# Patient Record
Sex: Male | Born: 1965 | Race: Black or African American | Hispanic: No | Marital: Married | Smoking: Never smoker
Health system: Southern US, Community
[De-identification: ages and names within clinical notes are randomized; demographics above are authoritative.]

## PROBLEM LIST (undated history)

## (undated) DIAGNOSIS — R351 Nocturia: Secondary | ICD-10-CM

## (undated) DIAGNOSIS — N200 Calculus of kidney: Secondary | ICD-10-CM

## (undated) DIAGNOSIS — Z87442 Personal history of urinary calculi: Secondary | ICD-10-CM

## (undated) DIAGNOSIS — Z8669 Personal history of other diseases of the nervous system and sense organs: Secondary | ICD-10-CM

## (undated) DIAGNOSIS — M51379 Other intervertebral disc degeneration, lumbosacral region without mention of lumbar back pain or lower extremity pain: Secondary | ICD-10-CM

## (undated) DIAGNOSIS — G4730 Sleep apnea, unspecified: Secondary | ICD-10-CM

## (undated) DIAGNOSIS — N189 Chronic kidney disease, unspecified: Secondary | ICD-10-CM

## (undated) DIAGNOSIS — M5137 Other intervertebral disc degeneration, lumbosacral region: Secondary | ICD-10-CM

## (undated) DIAGNOSIS — M5126 Other intervertebral disc displacement, lumbar region: Secondary | ICD-10-CM

## (undated) DIAGNOSIS — M48061 Spinal stenosis, lumbar region without neurogenic claudication: Secondary | ICD-10-CM

## (undated) DIAGNOSIS — J189 Pneumonia, unspecified organism: Secondary | ICD-10-CM

## (undated) DIAGNOSIS — M5136 Other intervertebral disc degeneration, lumbar region: Secondary | ICD-10-CM

## (undated) DIAGNOSIS — M4800 Spinal stenosis, site unspecified: Secondary | ICD-10-CM

## (undated) DIAGNOSIS — M5432 Sciatica, left side: Secondary | ICD-10-CM

## (undated) DIAGNOSIS — G4733 Obstructive sleep apnea (adult) (pediatric): Secondary | ICD-10-CM

## (undated) DIAGNOSIS — M51369 Other intervertebral disc degeneration, lumbar region without mention of lumbar back pain or lower extremity pain: Secondary | ICD-10-CM

---
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
## 2000-04-19 ENCOUNTER — Emergency Department (HOSPITAL_COMMUNITY): Admission: EM | Admit: 2000-04-19 | Discharge: 2000-04-19 | Payer: Self-pay | Admitting: Emergency Medicine

## 2005-04-12 ENCOUNTER — Ambulatory Visit: Payer: Self-pay | Admitting: Family Medicine

## 2005-04-30 ENCOUNTER — Ambulatory Visit: Payer: Self-pay | Admitting: Internal Medicine

## 2005-05-19 ENCOUNTER — Ambulatory Visit: Payer: Self-pay | Admitting: Family Medicine

## 2005-10-04 ENCOUNTER — Emergency Department (HOSPITAL_COMMUNITY): Admission: EM | Admit: 2005-10-04 | Discharge: 2005-10-04 | Payer: Self-pay | Admitting: Emergency Medicine

## 2006-03-14 ENCOUNTER — Emergency Department (HOSPITAL_COMMUNITY): Admission: EM | Admit: 2006-03-14 | Discharge: 2006-03-14 | Payer: Self-pay | Admitting: Emergency Medicine

## 2006-04-15 ENCOUNTER — Emergency Department (HOSPITAL_COMMUNITY): Admission: EM | Admit: 2006-04-15 | Discharge: 2006-04-15 | Payer: Self-pay | Admitting: Family Medicine

## 2007-07-10 DIAGNOSIS — Z8709 Personal history of other diseases of the respiratory system: Secondary | ICD-10-CM | POA: Insufficient documentation

## 2007-10-31 ENCOUNTER — Emergency Department (HOSPITAL_COMMUNITY): Admission: EM | Admit: 2007-10-31 | Discharge: 2007-10-31 | Payer: Self-pay | Admitting: Emergency Medicine

## 2011-07-02 LAB — URINALYSIS, ROUTINE W REFLEX MICROSCOPIC
Glucose, UA: NEGATIVE
Nitrite: NEGATIVE
Protein, ur: NEGATIVE
Urobilinogen, UA: 0.2

## 2011-07-02 LAB — URINE MICROSCOPIC-ADD ON

## 2011-10-13 ENCOUNTER — Ambulatory Visit (HOSPITAL_COMMUNITY): Admission: RE | Admit: 2011-10-13 | Payer: Self-pay | Source: Ambulatory Visit

## 2011-10-13 ENCOUNTER — Ambulatory Visit (HOSPITAL_COMMUNITY)
Admission: RE | Admit: 2011-10-13 | Discharge: 2011-10-13 | Disposition: A | Discharge status: Home / Self Care | Payer: Self-pay | Source: Ambulatory Visit | Attending: Cardiovascular Disease | Admitting: Cardiovascular Disease

## 2011-10-13 ENCOUNTER — Other Ambulatory Visit: Payer: Self-pay

## 2011-10-13 DIAGNOSIS — R9431 Abnormal electrocardiogram [ECG] [EKG]: Secondary | ICD-10-CM | POA: Insufficient documentation

## 2011-10-13 DIAGNOSIS — R079 Chest pain, unspecified: Secondary | ICD-10-CM | POA: Insufficient documentation

## 2012-06-22 ENCOUNTER — Other Ambulatory Visit (HOSPITAL_COMMUNITY): Payer: Self-pay | Admitting: Family Medicine

## 2012-06-22 DIAGNOSIS — R319 Hematuria, unspecified: Secondary | ICD-10-CM

## 2012-06-26 ENCOUNTER — Ambulatory Visit (HOSPITAL_COMMUNITY)
Admission: RE | Admit: 2012-06-26 | Discharge: 2012-06-26 | Disposition: A | Discharge status: Home / Self Care | Payer: BC Managed Care – PPO | Source: Ambulatory Visit | Attending: Family Medicine | Admitting: Family Medicine

## 2012-06-26 ENCOUNTER — Other Ambulatory Visit (HOSPITAL_COMMUNITY): Payer: Self-pay | Admitting: Family Medicine

## 2012-06-26 DIAGNOSIS — N200 Calculus of kidney: Secondary | ICD-10-CM | POA: Insufficient documentation

## 2012-06-26 DIAGNOSIS — R3129 Other microscopic hematuria: Secondary | ICD-10-CM | POA: Insufficient documentation

## 2012-06-26 DIAGNOSIS — K7689 Other specified diseases of liver: Secondary | ICD-10-CM | POA: Insufficient documentation

## 2012-06-26 DIAGNOSIS — R319 Hematuria, unspecified: Secondary | ICD-10-CM

## 2014-05-13 ENCOUNTER — Ambulatory Visit (INDEPENDENT_AMBULATORY_CARE_PROVIDER_SITE_OTHER): Payer: BC Managed Care – PPO | Admitting: Neurology

## 2014-05-13 ENCOUNTER — Encounter: Payer: Self-pay | Admitting: Neurology

## 2014-05-13 VITALS — BP 134/75 | HR 84 | Resp 17 | Ht 70.5 in | Wt 245.0 lb

## 2014-05-13 DIAGNOSIS — R0609 Other forms of dyspnea: Secondary | ICD-10-CM

## 2014-05-13 DIAGNOSIS — R6889 Other general symptoms and signs: Secondary | ICD-10-CM

## 2014-05-13 DIAGNOSIS — E669 Obesity, unspecified: Secondary | ICD-10-CM | POA: Insufficient documentation

## 2014-05-13 DIAGNOSIS — G4730 Sleep apnea, unspecified: Principal | ICD-10-CM

## 2014-05-13 DIAGNOSIS — IMO0002 Reserved for concepts with insufficient information to code with codable children: Secondary | ICD-10-CM

## 2014-05-13 DIAGNOSIS — R0683 Snoring: Secondary | ICD-10-CM

## 2014-05-13 DIAGNOSIS — R0989 Other specified symptoms and signs involving the circulatory and respiratory systems: Secondary | ICD-10-CM

## 2014-05-13 DIAGNOSIS — G4710 Hypersomnia, unspecified: Secondary | ICD-10-CM | POA: Insufficient documentation

## 2014-05-13 NOTE — Progress Notes (Signed)
[facility] SLEEP MEDICINE CONSULT   Provider:  Melvyn Novas, M D  Referring Provider: Erlinda Hong, MD Primary Care Physician:  Erlinda Hong, MD  Chief Complaint  Patient presents with  . New Evaluation    Room 10  . Sleep consult    HPI:  Daniel Deleon is a 48 y.o. part native [place]  male , who is seen here as a referral from Dr. Thurmond Butts for a sleep evaluation ,   Mr. Daniel Deleon, a nephew of Dr. Erlinda Hong by marriage, presents for an evaluation prior to a sleep study.  Mr. Daniel Deleon reports that he has a strong family history of a stroke with sleep apnea in that he has himself nor bowel that is affected. He reports snoring loudly and sometimes snoring or gasping for air and he wakes up. He has been witnessed to snore and and gasping for breath. His marital bedroom is cool quiet and dark.  Patient also reports that he has often vivid dreams. His sleep is fragmented : he feels that he has not slept through the night in quite a while.  He has also  3-4 bathroom breaks at night further fragmenting his sleep. Is not necessarily refreshed or restored in the morning when he wakes up and he desires to take naps. Nap  last about an hour but he feels much more refreshed after nap time , more then in the morning.  When he wakes up he has a dry mouth, he does not necessarily wake up with headaches but has a tendency to develop headaches especially during the school year.  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  He has been sleepier than the average  Individual, he has 3 school age children and attends their games , tec. He is more fatigued . He keeps regular sleep habits, going to bed around mid-night , he works up at 6 AM, and had 3 -4 arousals from nocturia. He wakes spontaneously , but not refreshed or restored. He is sleep deprived with less than 5.5 hours of  sleep at night. 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 Both play other sports: soccer, tennis and are running as well. He sleeps in hotels, but feels that sleep quality is even worse.   He drinks rarely SODAS only when driving, drinks  coffee, and no tea.  Non smoker.  No ETOH drinker.   Weight gain over 5 years about 20 pounds.   Review of Systems: Out of a complete 14 system review, the patient complains of only the following symptoms, and all other reviewed systems are negative.  snoring, witnessed apnea,EDS  Epworth 8   History   Social History  . Marital Status: Married    Spouse Name: Moishe Spice    Number of Children: 3  . Years of Education: Grad sch.   Occupational History  . Not on file.   Social History Main Topics  . Smoking status: Never Smoker   . Smokeless tobacco: Never Used  . Alcohol Use: No  . Drug Use: No  . Sexual Activity: Not on file   Other Topics Concern  . Not on file   Social History Narrative   Patient is married [name] and lives at home with his wife and three children.   Patient works full-time at [employer].   Patient has a college education [employer] school).  Patient is ambi-dextrous.   Patient drinks one soda daily.    Family History  Problem Relation Age of Onset  . Bladder Cancer Father   . Hypertension Father   . Hypertension Brother   . Diabetes Father     History reviewed. No pertinent past medical history.  Past Surgical History  Procedure Laterality Date  . Eye surgery  1977    Cyst removed    No current outpatient prescriptions on file.   No current facility-administered medications for this visit.    Allergies as of 05/13/2014 - Review Complete 05/13/2014  Allergen Reaction Noted  . Diphenhydramine hcl  07/10/2007    Vitals: BP 134/75  Pulse 84  Resp 17  Ht 5' 10.5" (1.791 m)  Wt 245 lb (111.131 kg)  BMI 34.65 kg/m2 Last Weight:  Wt Readings from Last 1  Encounters:  05/13/14 245 lb (111.131 kg)   Last Height:   Ht Readings from Last 1 Encounters:  05/13/14 5' 10.5" (1.791 m)    Physical exam:  General: The patient is awake, alert and appears not in acute distress. The patient is well groomed. Head: Normocephalic, atraumatic. Neck is supple. Mallampati 3 , neck circumference:19 inches , no TMJ , no clicking.  Cardiovascular:  Regular rate and rhythm , without  murmurs or carotid bruit, and without distended neck veins. Respiratory: Lungs are clear to auscultation. Skin:  Without evidence of edema, or rash Trunk: BMI is  elevated and patient  has normal posture.  Neurologic exam : The patient is awake and alert, oriented to place and time.  Memory subjective  described as intact. There is a normal attention span & concentration ability. Speech is fluent without dysarthria, dysphonia or aphasia. Mood and affect are appropriate.  Cranial nerves: Pupils are equal and briskly reactive to light. Funduscopic exam without evidence of pallor or edema. Extraocular movements  in vertical and horizontal planes intact and without nystagmus. Visual fields by finger perimetry are intact. Hearing to finger rub intact.  Facial sensation intact to fine touch. Facial motor strength is symmetric and tongue and uvula move midline.  Motor exam:   Normal tone and normal muscle bulk and symmetric normal strength in all extremities.  Sensory:  Fine touch, pinprick and vibration were normal.  Coordination: Rapid alternating movements in the fingers/hands is tested and normal.  Gait and station: Patient walks without assistive device .Marland Kitchen. Stance is stable and normal. Steps are unfragmented.  Deep tendon reflexes: in the  upper and lower extremities are symmetric and intact. Babinski maneuver response is downgoing.   Assessment:  After physical and neurologic examination, review of laboratory studies, imaging, neurophysiology testing and pre-existing records,  assessment is   1) hypersomnia, daily naps, nocturia, snoring and witnessed apneas. 2) He wakes up from  Vivid dreams, often with a racing heart but has not acted out dreams.  3) family history of OSA, HTN. And higher risk due to native [place].   Plan:  Treatment plan and additional workup : SPLIT night study - AH 10 and score at 3 % . CO2 if available. Get some supine sleep in the first 2 hours.  Mask fitting in lab ,if patient  has enough apnea to SPLIT

## 2014-05-13 NOTE — Patient Instructions (Signed)
Polysomnography (Sleep Studies) Polysomnography (PSG) is a series of tests used for detecting (diagnosing) obstructive sleep apnea and other sleep disorders. The tests measure how some parts of your body are working while you are sleeping. The tests are extensive and expensive. They are done in a sleep lab or hospital, and vary from center to center. Your caregiver may perform other more simple sleep studies and questionnaires before doing more complete and involved testing. Testing may not be covered by insurance. Some of these tests are:  An EEG (Electroencephalogram). This tests your brain waves and stages of sleep.  An EOG (Electrooculogram). This measures the movements of your eyes. It detects periods of REM (rapid eye movement) sleep, which is your dream sleep.  An EKG (Electrocardiogram). This measures your heart rhythm.  EMG (Electromyography). This is a measurement of how the muscles are working in your upper airway and your legs while sleeping.  An oximetry measurement. It measures how much oxygen (air) you are getting while sleeping.  Breathing efforts may be measured. The same test can be interpreted (understood) differently by different caregivers and centers that study sleep.  Studies may be given an apnea/hypopnea index (AHI). This is a number which is found by counting the times of no breathing or under breathing during the night, and relating those numbers to the amount of time spent in bed. When the AHI is greater than 15, the patient is likely to complain of daytime sleepiness. When the AHI is greater than 30, the patient is at increased risk for heart problems and must be followed more closely. Following the AHI also allows you to know how treatment is working. Simple oximetry (tracking the amount of oxygen that is taken in) can be used for screening patients who:  Do not have symptoms (problems) of OSA.  Have a normal Epworth Sleepiness Scale Score.  Have a low pre-test  probability of having OSA.  Have none of the upper airway problems likely to cause apnea.  Oximetry is also used to determine if treatment is effective in patients who showed significant desaturations (not getting enough oxygen) on their home sleep study. One extra measure of safety is to perform additional studies for the person who only snores. This is because no one can predict with absolute certainty who will have OSA. Those who show significant desaturations (not getting enough oxygen) are recommended to have a more detailed sleep study. Document Released: 04/03/2003 Document Revised: 12/20/2011 Document Reviewed: 12/03/2013 [employer] Patient Information 2015 [employer]. This information is not intended to replace advice given to you by your health care provider. Make sure you discuss any questions you have with your health care provider.  

## 2014-06-06 ENCOUNTER — Encounter: Payer: Self-pay | Admitting: Neurology

## 2014-06-06 ENCOUNTER — Ambulatory Visit (INDEPENDENT_AMBULATORY_CARE_PROVIDER_SITE_OTHER): Payer: BC Managed Care – PPO | Admitting: Neurology

## 2014-06-06 DIAGNOSIS — G4730 Sleep apnea, unspecified: Principal | ICD-10-CM

## 2014-06-06 DIAGNOSIS — G4731 Primary central sleep apnea: Secondary | ICD-10-CM

## 2014-06-06 DIAGNOSIS — G4733 Obstructive sleep apnea (adult) (pediatric): Secondary | ICD-10-CM

## 2014-06-06 DIAGNOSIS — G4700 Insomnia, unspecified: Secondary | ICD-10-CM

## 2014-06-06 DIAGNOSIS — R0683 Snoring: Secondary | ICD-10-CM

## 2014-06-06 DIAGNOSIS — R6889 Other general symptoms and signs: Secondary | ICD-10-CM

## 2014-06-06 DIAGNOSIS — G4710 Hypersomnia, unspecified: Secondary | ICD-10-CM

## 2014-06-18 ENCOUNTER — Other Ambulatory Visit: Payer: Self-pay | Admitting: Neurology

## 2014-06-18 ENCOUNTER — Telehealth: Payer: Self-pay | Admitting: *Deleted

## 2014-06-18 DIAGNOSIS — G4700 Insomnia, unspecified: Secondary | ICD-10-CM

## 2014-06-18 DIAGNOSIS — G4730 Sleep apnea, unspecified: Principal | ICD-10-CM

## 2014-06-18 DIAGNOSIS — G4731 Primary central sleep apnea: Secondary | ICD-10-CM

## 2014-06-18 NOTE — Telephone Encounter (Signed)
Patient was contacted and informed of Split night study results via phone.  A copy of the report to be mailed to the patient as well as the referring physician Dr. Erlinda Hong.  Patient was ordered to be put on Auto CPAP unit with a range of pressure between 8-14 cm H20.  [facility] to be contacted to provide CPAP supplies.

## 2014-06-27 ENCOUNTER — Encounter: Payer: Self-pay | Admitting: Neurology

## 2015-03-15 ENCOUNTER — Emergency Department (HOSPITAL_COMMUNITY)
Admission: EM | Admit: 2015-03-15 | Discharge: 2015-03-15 | Disposition: A | Discharge status: Home / Self Care | Payer: BC Managed Care – PPO | Attending: Emergency Medicine | Admitting: Emergency Medicine

## 2015-03-15 ENCOUNTER — Emergency Department (HOSPITAL_COMMUNITY): Payer: BC Managed Care – PPO

## 2015-03-15 ENCOUNTER — Encounter (HOSPITAL_COMMUNITY): Payer: Self-pay

## 2015-03-15 DIAGNOSIS — N23 Unspecified renal colic: Secondary | ICD-10-CM

## 2015-03-15 DIAGNOSIS — N201 Calculus of ureter: Secondary | ICD-10-CM | POA: Diagnosis not present

## 2015-03-15 DIAGNOSIS — R1031 Right lower quadrant pain: Secondary | ICD-10-CM | POA: Diagnosis present

## 2015-03-15 DIAGNOSIS — N132 Hydronephrosis with renal and ureteral calculous obstruction: Secondary | ICD-10-CM

## 2015-03-15 DIAGNOSIS — Z79899 Other long term (current) drug therapy: Secondary | ICD-10-CM | POA: Diagnosis not present

## 2015-03-15 DIAGNOSIS — R109 Unspecified abdominal pain: Secondary | ICD-10-CM

## 2015-03-15 LAB — CBC WITH DIFFERENTIAL/PLATELET
BASOS PCT: 0 % (ref 0–1)
Basophils Absolute: 0 10*3/uL (ref 0.0–0.1)
Eosinophils Absolute: 0 10*3/uL (ref 0.0–0.7)
Eosinophils Relative: 1 % (ref 0–5)
HEMATOCRIT: 42 % (ref 39.0–52.0)
Hemoglobin: 14.1 g/dL (ref 13.0–17.0)
LYMPHS PCT: 36 % (ref 12–46)
Lymphs Abs: 2.1 10*3/uL (ref 0.7–4.0)
MCH: 28.6 pg (ref 26.0–34.0)
MCHC: 33.6 g/dL (ref 30.0–36.0)
MCV: 85.2 fL (ref 78.0–100.0)
Monocytes Absolute: 0.4 10*3/uL (ref 0.1–1.0)
Monocytes Relative: 7 % (ref 3–12)
NEUTROS ABS: 3.3 10*3/uL (ref 1.7–7.7)
Neutrophils Relative %: 56 % (ref 43–77)
PLATELETS: 169 10*3/uL (ref 150–400)
RBC: 4.93 MIL/uL (ref 4.22–5.81)
RDW: 13 % (ref 11.5–15.5)
WBC: 5.8 10*3/uL (ref 4.0–10.5)

## 2015-03-15 LAB — URINALYSIS, ROUTINE W REFLEX MICROSCOPIC
Bilirubin Urine: NEGATIVE
Glucose, UA: NEGATIVE mg/dL
Ketones, ur: NEGATIVE mg/dL
NITRITE: NEGATIVE
PROTEIN: 30 mg/dL — AB
SPECIFIC GRAVITY, URINE: 1.022 (ref 1.005–1.030)
UROBILINOGEN UA: 0.2 mg/dL (ref 0.0–1.0)
pH: 5 (ref 5.0–8.0)

## 2015-03-15 LAB — COMPREHENSIVE METABOLIC PANEL
ALK PHOS: 57 U/L (ref 38–126)
ALT: 45 U/L (ref 17–63)
ANION GAP: 11 (ref 5–15)
AST: 43 U/L — ABNORMAL HIGH (ref 15–41)
Albumin: 4.3 g/dL (ref 3.5–5.0)
BILIRUBIN TOTAL: 1.3 mg/dL — AB (ref 0.3–1.2)
BUN: 10 mg/dL (ref 6–20)
CHLORIDE: 101 mmol/L (ref 101–111)
CO2: 26 mmol/L (ref 22–32)
Calcium: 9.1 mg/dL (ref 8.9–10.3)
Creatinine, Ser: 1.29 mg/dL — ABNORMAL HIGH (ref 0.61–1.24)
GFR calc Af Amer: >60 mL/min (ref >60)
GFR calc non Af Amer: >60 mL/min (ref >60)
GLUCOSE: 132 mg/dL — AB (ref 65–99)
POTASSIUM: 3.1 mmol/L — AB (ref 3.5–5.1)
Sodium: 138 mmol/L (ref 135–145)
Total Protein: 7.2 g/dL (ref 6.5–8.1)

## 2015-03-15 LAB — URINE MICROSCOPIC-ADD ON

## 2015-03-15 MED ORDER — FENTANYL CITRATE (PF) 100 MCG/2ML IJ SOLN
INTRAMUSCULAR | Status: AC
  Filled 2015-03-15: qty 2

## 2015-03-15 MED ORDER — SODIUM CHLORIDE 0.9 % IV BOLUS (SEPSIS): 500.0000 mL | Freq: Once | INTRAVENOUS | Status: DC

## 2015-03-15 MED ORDER — OXYCODONE-ACETAMINOPHEN 5-325 MG PO TABS: 1.0000 | ORAL_TABLET | ORAL | Status: DC | PRN

## 2015-03-15 MED ORDER — ONDANSETRON 4 MG PO TBDP: 8.0000 mg | ORAL_TABLET | Freq: Once | ORAL | Status: DC

## 2015-03-15 MED ORDER — TAMSULOSIN HCL 0.4 MG PO CAPS: 0.4000 mg | ORAL_CAPSULE | Freq: Two times a day (BID) | ORAL | Status: DC

## 2015-03-15 MED ORDER — ONDANSETRON 4 MG PO TBDP: ORAL_TABLET | ORAL | Status: DC

## 2015-03-15 MED ORDER — FENTANYL CITRATE (PF) 100 MCG/2ML IJ SOLN
50.0000 ug | Freq: Once | INTRAMUSCULAR | Status: AC
  Administered 2015-03-15: 50 ug via NASAL

## 2015-03-15 NOTE — ED Notes (Signed)
This RN and second RN attempted IV without success.  Pt very anxious and unable to relax for stick due to previous experience with unsuccessful attempts.  Pt heart rate and BP elevated with attempts.  This RN made PA and MD aware.

## 2015-03-15 NOTE — ED Notes (Signed)
Pt declined Zofran and pain med at this time.  Pt reports feeling little better after last vomit.

## 2015-03-15 NOTE — ED Notes (Signed)
Pt given water and crackers, family remains at bedside

## 2015-03-15 NOTE — ED Notes (Signed)
Provider at bedside reviewing CT results. Pt tolerated PO flujids and crackers well

## 2015-03-15 NOTE — ED Notes (Signed)
Pt talking with family members. Awaiting CT results

## 2015-03-15 NOTE — ED Notes (Signed)
Pt reports 30 minutes ago right flank and right groin pain.  Vomited x 1.  Pt was woke up 2 nights ago with pain ro right flank, took hot shower, Tylenol and pain subsided.

## 2015-03-15 NOTE — Discharge Instructions (Signed)
1. Medications: percocet, zofran, flomax, usual home medications 2. Treatment: rest, drink plenty of fluids,  3. Follow Up: Please followup with your urologist in 2-3 days for discussion of your diagnoses and further evaluation after today's visit; if you do not have a primary care doctor use the resource guide provided to find one; Please return to the ER for worsening symptoms, fever, persistent vomiting.    Kidney Stones Kidney stones (urolithiasis) are deposits that form inside your kidneys. The intense pain is caused by the stone moving through the urinary tract. When the stone moves, the ureter goes into spasm around the stone. The stone is usually passed in the urine.  CAUSES   A disorder that makes certain neck glands produce too much parathyroid hormone (primary hyperparathyroidism).  A buildup of uric acid crystals, similar to gout in your joints.  Narrowing (stricture) of the ureter.  A kidney obstruction present at birth (congenital obstruction).  Previous surgery on the kidney or ureters.  Numerous kidney infections. SYMPTOMS   Feeling sick to your stomach (nauseous).  Throwing up (vomiting).  Blood in the urine (hematuria).  Pain that usually spreads (radiates) to the groin.  Frequency or urgency of urination. DIAGNOSIS   Taking a history and physical exam.  Blood or urine tests.  CT scan.  Occasionally, an examination of the inside of the urinary bladder (cystoscopy) is performed. TREATMENT   Observation.  Increasing your fluid intake.  Extracorporeal shock wave lithotripsy--This is a noninvasive procedure that uses shock waves to break up kidney stones.  Surgery may be needed if you have severe pain or persistent obstruction. There are various surgical procedures. Most of the procedures are performed with the use of small instruments. Only small incisions are needed to accommodate these instruments, so recovery time is minimized. The size, location,  and chemical composition are all important variables that will determine the proper choice of action for you. Talk to your health care provider to better understand your situation so that you will minimize the risk of injury to yourself and your kidney.  HOME CARE INSTRUCTIONS   Drink enough water and fluids to keep your urine clear or pale yellow. This will help you to pass the stone or stone fragments.  Strain all urine through the provided strainer. Keep all particulate matter and stones for your health care provider to see. The stone causing the pain may be as small as a grain of salt. It is very important to use the strainer each and every time you pass your urine. The collection of your stone will allow your health care provider to analyze it and verify that a stone has actually passed. The stone analysis will often identify what you can do to reduce the incidence of recurrences.  Only take over-the-counter or prescription medicines for pain, discomfort, or fever as directed by your health care provider.  Make a follow-up appointment with your health care provider as directed.  Get follow-up X-rays if required. The absence of pain does not always mean that the stone has passed. It may have only stopped moving. If the urine remains completely obstructed, it can cause loss of kidney function or even complete destruction of the kidney. It is your responsibility to make sure X-rays and follow-ups are completed. Ultrasounds of the kidney can show blockages and the status of the kidney. Ultrasounds are not associated with any radiation and can be performed easily in a matter of minutes. SEEK MEDICAL CARE IF:  You experience pain that  is progressive and unresponsive to any pain medicine you have been prescribed. SEEK IMMEDIATE MEDICAL CARE IF:   Pain cannot be controlled with the prescribed medicine.  You have a fever or shaking chills.  The severity or intensity of pain increases over 18 hours  and is not relieved by pain medicine.  You develop a new onset of abdominal pain.  You feel faint or pass out.  You are unable to urinate. MAKE SURE YOU:   Understand these instructions.  Will watch your condition.  Will get help right away if you are not doing well or get worse. Document Released: 09/27/2005 Document Revised: 05/30/2013 Document Reviewed: 02/28/2013 [facility] Patient Information 2015 [place], [place]. This information is not intended to replace advice given to you by your health care provider. Make sure you discuss any questions you have with your health care provider.

## 2015-03-15 NOTE — ED Provider Notes (Signed)
CSN: [id number]     Arrival date & time 03/15/15  1801 History   First MD Initiated Contact with Patient 03/15/15 2026     Chief Complaint  Patient presents with  . Flank Pain     (Consider location/radiation/quality/duration/timing/severity/associated sxs/prior Treatment) The history is provided by the patient and medical records. No language interpreter was used.     Daniel Deleon is a 49 y.o. male  with no major medical problsm presents to the Emergency Department complaining of gradual, persistent, progressively worsening right flank and groin pain onset PTA. These reports the stabbing pain is located in his right flank and radiates through his right lower abdomen into his right testicle. He denies penile pain, penile discharge or previous scrotal pain.   Denies scrotal trauma. Patient reports a distant history of kidney stones approximately 3-4 years ago. He reports symptoms tonight are the same as previous ones. He reports associated nausea and vomiting which improved the pain slightly. Patient was given intranasal fentanyl upon arrival in the emergency department with significant improvement in his pain. 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 He reports he feels very dehydrated. Nothing makes the symptoms better or worse. He denies fever, chills, headache, neck pain, chest pain, shortness of breath, abdominal pain, dysuria, hematuria, urinary urgency, urinary frequency, syncope, near syncope.    History reviewed. No pertinent past medical history. Past Surgical History  Procedure Laterality Date  . Eye surgery  1977    Cyst removed   Family History  Problem Relation Age of Onset  . Bladder Cancer Father   . Hypertension Father   . Hypertension Brother   . Diabetes Father    History  Substance Use Topics  . Smoking status: Never Smoker   . Smokeless tobacco: Never Used  . Alcohol Use: No    Review of  Systems  Constitutional: Negative for fever, diaphoresis, appetite change and fatigue.  Respiratory: Negative for shortness of breath.   Cardiovascular: Negative for chest pain.  Gastrointestinal: Positive for nausea, vomiting and abdominal pain ( RLQ). Negative for diarrhea, constipation and blood in stool.  Genitourinary: Positive for flank pain and testicular pain (right). Negative for dysuria, urgency, frequency, hematuria and difficulty urinating.  Musculoskeletal: Negative for back pain.  Skin: Negative for rash.  Neurological: Negative for headaches.  All other systems reviewed and are negative.     Allergies  Diphenhydramine hcl  Home Medications   Prior to Admission medications   Medication Sig Start Date End Date Taking? Authorizing Provider  ondansetron (ZOFRAN ODT) 4 MG disintegrating tablet  ODT q4 hours prn nausea/vomit 03/15/15   Shemia Bevel, PA-C  oxyCODONE-acetaminophen (PERCOCET) 5-325 MG per tablet Take 1-2 tablets by mouth every 4 (four) hours as needed. 03/15/15   Merion Grimaldo, PA-C  tamsulosin (FLOMAX) 0.4 MG CAPS capsule Take 1 capsule (0.4 mg total) by mouth 2 (two) times daily. 03/15/15   Wayman Hoard, PA-C   BP 156/100 mmHg  Pulse 80  Temp(Src) 98.7 F (37.1 C) (Oral)  Resp 14  Ht  (1.778 m)  Wt 242 lb (109.77 kg)  BMI 34.72 kg/m2  SpO2 88% Physical Exam  Constitutional: He appears well-developed and well-nourished. No distress.  Awake, alert, nontoxic appearance  HENT:  Head: Normocephalic and atraumatic.  Mouth/Throat: Oropharynx is clear and moist. No oropharyngeal exudate.  Eyes: Conjunctivae are normal. No scleral icterus.  Neck: Normal range of motion.  Neck supple.  Cardiovascular: Normal rate, regular rhythm, normal heart sounds and intact distal pulses.   Pulmonary/Chest: Effort normal and breath sounds normal. No respiratory distress. He has no wheezes.  Equal chest expansion  Abdominal: Soft. Bowel sounds are  normal. He exhibits no distension and no mass. There is no tenderness. There is no rebound, no guarding and no CVA tenderness. Hernia confirmed negative in the right inguinal area and confirmed negative in the left inguinal area.  Genitourinary: Testes normal and penis normal. Cremasteric reflex is present. Right testis shows no mass, no swelling and no tenderness. Right testis is descended. Cremasteric reflex is not absent on the right side. Left testis shows no mass, no swelling and no tenderness. Left testis is descended. Cremasteric reflex is not absent on the left side. Circumcised. No phimosis, paraphimosis, hypospadias, penile erythema or penile tenderness. No discharge found.  Musculoskeletal: Normal range of motion. He exhibits no edema.  Lymphadenopathy:       Right: No inguinal adenopathy present.       Left: No inguinal adenopathy present.  Neurological: He is alert.  Speech is clear and goal oriented Moves extremities without ataxia  Skin: Skin is warm and dry. No rash noted. He is not diaphoretic.  Psychiatric: He has a normal mood and affect.  Nursing note and vitals reviewed.   ED Course  Procedures (including critical care time) Labs Review Labs Reviewed  COMPREHENSIVE METABOLIC PANEL - Abnormal; Notable for the following:    Potassium 3.1 (*)    Glucose, Bld 132 (*)    Creatinine, Ser 1.29 (*)    AST 43 (*)    Total Bilirubin 1.3 (*)    All other components within normal limits  URINALYSIS, ROUTINE W REFLEX MICROSCOPIC (NOT AT [facility]) - Abnormal; Notable for the following:    Hgb urine dipstick LARGE (*)    Protein, ur 30 (*)    Leukocytes, UA SMALL (*)    All other components within normal limits  CBC WITH DIFFERENTIAL/PLATELET  URINE MICROSCOPIC-ADD ON    Imaging Review Ct Renal Stone Study  03/15/2015   CLINICAL DATA:  Acute onset of right flank and right groin pain. Vomiting. Initial encounter.  EXAM: CT ABDOMEN AND PELVIS WITHOUT CONTRAST  TECHNIQUE:  Multidetector CT imaging of the abdomen and pelvis was performed following the standard protocol without IV contrast.  COMPARISON:  CT of the abdomen and pelvis from 06/26/2012  FINDINGS: The visualized lung bases are clear.  There is mild fatty infiltration within the liver, with mild sparing about the gallbladder fossa. The spleen is unremarkable in appearance. The gallbladder is within normal limits. The pancreas and adrenal glands are unremarkable.  There is a 7 x 4 mm obstructing stone in the distal right ureter, approximately 5 cm proximal to the right vesicoureteral junction. Minimal associated right-sided hydronephrosis is seen. Scattered nonobstructing bilateral renal stones are seen, more prominent on the left, measuring up to 1.0 cm in size. No significant perinephric stranding is appreciated.  No free fluid is identified. The small bowel is unremarkable in appearance. The stomach is within normal limits. No acute vascular abnormalities are seen.  The appendix is normal in caliber and contains air, without evidence for appendicitis. The colon is unremarkable in appearance.  The bladder is mildly distended. A small urachal remnant is incidentally seen. A tiny umbilical hernia is noted, containing only fat. The prostate remains normal in size. No inguinal lymphadenopathy is seen.  No acute osseous abnormalities are identified. 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  IMPRESSION: 1. Minimal right-sided hydronephrosis, with a 7 x 4 mm obstructing stone at the distal right ureter, 5 cm proximal to the right vesicoureteral junction. 2. Scattered nonobstructing bilateral renal stones, more prominent on the left, measuring up to 1.0 cm in size. 3. Mild fatty infiltration within the liver. 4. Tiny umbilical hernia, containing only fat. 5. 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    Electronically Signed   By: Roanna Raider M.D.   On: 03/15/2015 22:49     EKG Interpretation None      MDM   Final diagnoses:  Right flank pain  Ureteral colic  Ureteral stone with hydronephrosis    Santa Lighter resents with right flank pain, nausea, vomiting and right testicular pain. On exam patient's right testicle normal with normal cremaster reflex and no hernias. Doubt testicular torsion, more likely radiation of pain from the right flank and ureter.  UA with large hemoglobin. Slight elevation in serum creatinine 1.29 and hypokalemia noted. This is likely secondary to patient's long stent in the sun today and mild dehydration. Multiple times and IV were tried without success. Patient is refusing any further attempts at this time. Will allow by mouth hydration.  11:42 PM CT with large stone just proximal at the UVJ.  Urinalysis is without evidence of infection. Patient reports continued relief from pain. He is tolerating by mouth here in the emergency department without any further nausea or vomiting. Reports he feels well and wishes to be discharged home. Patient has a urologist and will be able to follow-up next week.  There is no evidence of significant hydronephrosis, slightly elevated serum creatine however pt has had decreased po fluid intake in the heat. He has continued to orally hydrate here in the ED. Vitals sign stable and the pt does not have irratractable vomiting. Pt will be dc home with pain medications & has been advised to follow up with urology.   BP 156/100 mmHg  Pulse 80  Temp(Src) 98.7 F (37.1 C) (Oral)  Resp 14  Ht  (1.778 m)  Wt 242 lb (109.77 kg)  BMI 34.72 kg/m2  SpO2 88%    Dierdre Forth, PA-C 03/15/15 2345  Richardean Canal, MD 03/16/15 [phone]

## 2015-10-12 DIAGNOSIS — Z8669 Personal history of other diseases of the nervous system and sense organs: Secondary | ICD-10-CM

## 2015-10-12 HISTORY — DX: Personal history of other diseases of the nervous system and sense organs: Z86.69

## 2015-11-05 ENCOUNTER — Other Ambulatory Visit: Payer: Self-pay | Admitting: Urology

## 2015-11-06 ENCOUNTER — Encounter (HOSPITAL_COMMUNITY): Payer: Self-pay | Admitting: General Practice

## 2015-11-07 NOTE — H&P (Signed)
Reason For Visit Lower back pain/Hematuria   Active Problems Problems  1. Benign microscopic hematuria (R31.1) 2. Gross hematuria (R31.0) 3. Left flank pain (R10.9) 4. Nephrolithiasis (N20.0) 5. Prostate cancer screening (Z12.5)  History of Present Illness 50 yo male, last seen 10/10/08 (former patient of Dr. Madilyn Hook) presents today for lower back pain and intermittent hematuria (last episode of gross hematuria was on 10/30/15) with a hx of kidney stones. He saw Dr. Shelby Dubin @ [facility] in Oct. 2016 for hx of kidney stones. He had a ureteral stone & was recommended surgery for a cysto/basket extraction of stone. He decided that he wanted to try to pass the stone & seek a 2nd opinion. He spontaneously passed the stone on 10/27/15, He has a hx of multiple bilateral stones.   Past Medical History Problems  1. History of kidney stones (Z61.096)  Surgical History Problems  1. History of Eye Surgery  Current Meds 1. Vitamin C TABS;  Therapy: (Recorded:24Jan2017) to Recorded  Allergies Medication  1. Benadryl CAPS  Family History Problems  1. Family history of Bladder Cancer  Social History Problems    Married   Never a smoker   No alcohol use   No caffeine use   Number of children   3 daughters   Occupation   [employer], broadcasting/film/video  Review of Systems  Genitourinary: urinary frequency, nocturia, hematuria and testicular pain.  Musculoskeletal: back pain.    Vitals Vital Signs [Data Includes: Last 1 Day]  Recorded: 24Jan2017 03:59PM  Height: 5 ft 10 in Weight: 246 lb  BMI Calculated: 35.3 BSA Calculated: 2.28 Blood Pressure: 152 / 90 Heart Rate: 86  Physical Exam Constitutional: Well nourished and well developed . No acute distress.  ENT:. The ears and nose are normal in appearance.  Neck: The appearance of the neck is normal and no neck mass is present.  Pulmonary: No respiratory distress and normal respiratory rhythm and effort.  Cardiovascular: Heart  rate and rhythm are normal . No peripheral edema.  Abdomen: The abdomen is soft and nontender. No masses are palpated. No CVA tenderness. No hernias are palpable. No hepatosplenomegaly noted.  Rectal: Rectal exam demonstrates normal sphincter tone, no tenderness and no masses. The prostate has no nodularity and is not tender. The left seminal vesicle is nonpalpable. The right seminal vesicle is nonpalpable. The perineum is normal on inspection.  Genitourinary: Examination of the penis demonstrates no discharge, no masses, no lesions and a normal meatus. The scrotum is without lesions. The right epididymis is palpably normal and non-tender. The left epididymis is palpably normal and non-tender. The right testis is non-tender and without masses. The left testis is non-tender and without masses.  Lymphatics: The femoral and inguinal nodes are not enlarged or tender.  Skin: Normal skin turgor, no visible rash and no visible skin lesions.  Neuro/Psych:. Mood and affect are appropriate.    Results/Data Urine [Data Includes: Last 1 Day]   24Jan2017  COLOR YELLOW   APPEARANCE CLEAR   SPECIFIC GRAVITY 1.010   pH 5.5   GLUCOSE NEGATIVE   BILIRUBIN NEGATIVE   KETONE NEGATIVE   BLOOD 2+   PROTEIN NEGATIVE   NITRITE NEGATIVE   LEUKOCYTE ESTERASE NEGATIVE   SQUAMOUS EPITHELIAL/HPF NONE SEEN HPF  WBC NONE SEEN WBC/HPF  RBC 3-10 RBC/HPF  BACTERIA NONE SEEN HPF  CRYSTALS NONE SEEN HPF  CASTS NONE SEEN LPF  Yeast NONE SEEN HPF   KUB: 2 stones found in the Left kidney: stone1: 10.86  with 445.6 HU; 63.7 mm to skin edge                                stone 2: 0.76cm, 441.45 HU   Assessment Assessed  1. Nephrolithiasis (N20.0) 2. Gross hematuria (R31.0) 3. Left flank pain (R10.9)  Pt has spontaneously passed stone-will send for analysis. He still has L flank pain. No fever, no chills. Drinks lots of water. Sodas: was drinking 3-4 16-20 oz Coke. Concern is for urine pH 5.5. He will need Litholink.    Plan Benign microscopic hematuria, Nephrolithiasis  1. KUB; Status:Resulted - Requires Verification;   Done: 24Jan2017 04:06PM Health Maintenance  2. UA With REFLEX; [Do Not Release]; Status:Resulted - Requires Verification;   Done:  24Jan2017 02:58PM Nephrolithiasis  3. Litholink Stone Risk-Urine; Status:Hold For - [employer];  Requested for:24Jan2017;  4. Stone Analysis; Status:Hold For - Specimen/Data Collection,Appointment; Requested  for:24Jan2017;   1. Send stone for analysis  2. Schedule lithotripsy at 5pm on Monday   Signatures Electronically signed by : Jethro Bolus, M.D.; Nov 04 2015  5:00PM EST

## 2015-11-10 NOTE — Progress Notes (Signed)
Patient called short stay and stated he will not have someone with him for the 24 hours following procedure as stated is needed per the [employer]. His wife has to leave in the early am tomorrow on business trip and he is scheduled for 5pm lithotripsy today. Spoke with Chasity at [facility] and she will contact patient and MD about rescheduling. He will not have procedure today.

## 2015-12-02 NOTE — Progress Notes (Signed)
Called patient for pre-op lithotripsy, pt. Stated "I passed my stone, I cancelled thru the office today".

## 2015-12-04 ENCOUNTER — Ambulatory Visit (HOSPITAL_COMMUNITY): Admission: RE | Admit: 2015-12-04 | Payer: BC Managed Care – PPO | Source: Ambulatory Visit | Admitting: Urology

## 2015-12-04 HISTORY — DX: Sleep apnea, unspecified: G47.30

## 2015-12-04 HISTORY — DX: Chronic kidney disease, unspecified: N18.9

## 2015-12-04 SURGERY — LITHOTRIPSY, ESWL
Anesthesia: LOCAL | Laterality: Left

## 2018-05-08 ENCOUNTER — Other Ambulatory Visit: Payer: Self-pay | Admitting: Orthopedic Surgery

## 2018-05-08 DIAGNOSIS — M25562 Pain in left knee: Secondary | ICD-10-CM

## 2018-05-11 HISTORY — PX: QUADRICEPS TENDON REPAIR: SHX756

## 2018-05-16 ENCOUNTER — Other Ambulatory Visit: Payer: BC Managed Care – PPO

## 2018-12-10 HISTORY — PX: COLONOSCOPY: SHX174

## 2019-04-17 ENCOUNTER — Other Ambulatory Visit: Payer: Self-pay | Admitting: Internal Medicine

## 2019-04-17 DIAGNOSIS — M5416 Radiculopathy, lumbar region: Secondary | ICD-10-CM

## 2019-05-03 ENCOUNTER — Other Ambulatory Visit: Payer: Self-pay

## 2019-05-03 ENCOUNTER — Ambulatory Visit
Admission: RE | Admit: 2019-05-03 | Discharge: 2019-05-03 | Disposition: A | Discharge status: Home / Self Care | Payer: BC Managed Care – PPO | Source: Ambulatory Visit | Attending: Internal Medicine | Admitting: Internal Medicine

## 2019-05-03 DIAGNOSIS — M5416 Radiculopathy, lumbar region: Secondary | ICD-10-CM

## 2019-06-30 ENCOUNTER — Other Ambulatory Visit: Payer: Self-pay

## 2019-06-30 DIAGNOSIS — Z20822 Contact with and (suspected) exposure to covid-19: Secondary | ICD-10-CM

## 2019-07-02 LAB — NOVEL CORONAVIRUS, NAA: SARS-CoV-2, NAA: NOT DETECTED

## 2019-08-27 ENCOUNTER — Other Ambulatory Visit: Payer: Self-pay | Admitting: Internal Medicine

## 2019-08-27 DIAGNOSIS — R1032 Left lower quadrant pain: Secondary | ICD-10-CM

## 2019-09-04 ENCOUNTER — Other Ambulatory Visit: Payer: Self-pay

## 2019-09-04 ENCOUNTER — Ambulatory Visit
Admission: RE | Admit: 2019-09-04 | Discharge: 2019-09-04 | Disposition: A | Discharge status: Home / Self Care | Payer: BC Managed Care – PPO | Source: Ambulatory Visit | Attending: Internal Medicine | Admitting: Internal Medicine

## 2019-09-04 DIAGNOSIS — R1032 Left lower quadrant pain: Secondary | ICD-10-CM

## 2019-09-04 MED ORDER — IOPAMIDOL (ISOVUE-300) INJECTION 61%
125.0000 mL | Freq: Once | INTRAVENOUS | Status: AC | PRN
  Administered 2019-09-04: 125 mL via INTRAVENOUS

## 2019-10-12 DIAGNOSIS — Z87442 Personal history of urinary calculi: Secondary | ICD-10-CM

## 2019-10-12 HISTORY — DX: Personal history of urinary calculi: Z87.442

## 2019-12-05 ENCOUNTER — Other Ambulatory Visit: Payer: Self-pay | Admitting: Urology

## 2019-12-05 ENCOUNTER — Other Ambulatory Visit (HOSPITAL_COMMUNITY): Payer: Self-pay | Admitting: Urology

## 2019-12-05 DIAGNOSIS — N200 Calculus of kidney: Secondary | ICD-10-CM

## 2020-01-15 ENCOUNTER — Encounter (HOSPITAL_COMMUNITY)
Admission: RE | Admit: 2020-01-15 | Discharge: 2020-01-15 | Disposition: A | Discharge status: Home / Self Care | Payer: BC Managed Care – PPO | Source: Ambulatory Visit | Attending: Urology | Admitting: Urology

## 2020-01-15 ENCOUNTER — Other Ambulatory Visit: Payer: Self-pay

## 2020-01-15 ENCOUNTER — Encounter (HOSPITAL_COMMUNITY): Payer: Self-pay

## 2020-01-15 HISTORY — DX: Personal history of urinary calculi: Z87.442

## 2020-01-15 HISTORY — DX: Other intervertebral disc displacement, lumbar region: M51.26

## 2020-01-15 HISTORY — DX: Other intervertebral disc degeneration, lumbar region: M51.36

## 2020-01-15 HISTORY — DX: Other intervertebral disc degeneration, lumbar region without mention of lumbar back pain or lower extremity pain: M51.369

## 2020-01-15 HISTORY — DX: Personal history of other diseases of the nervous system and sense organs: Z86.69

## 2020-01-15 NOTE — Progress Notes (Signed)
DUE TO COVID-19 ONLY ONE VISITOR IS ALLOWED TO COME WITH YOU AND STAY IN THE WAITING ROOM ONLY DURING PRE OP AND PROCEDURE DAY OF SURGERY. THE 1 VISITOR MAY VISIT WITH YOU AFTER SURGERY IN YOUR PRIVATE ROOM DURING VISITING HOURS ONLY!  YOU NEED TO HAVE A COVID 19 TEST ON__4/9/21 _____ @_______ , THIS TEST MUST BE DONE BEFORE SURGERY, COME  [place], [address] , [phone].  ([place]) ONCE YOUR COVID TEST IS COMPLETED, PLEASE BEGIN THE QUARANTINE INSTRUCTIONS AS OUTLINED IN YOUR HANDOUT.                Daniel Deleon  01/15/2020   Your procedure is scheduled on:  01/22/2020   Report to [facility] Main  Entrance   Report to admitting at   0800am   AM  Then report to Radiology after Admitting then to Short Stay.     Call this number if you have problems the morning of surgery [phone]    Remember: Do not eat food or drink liquids :After Midnight. BRUSH YOUR TEETH MORNING OF SURGERY AND RINSE YOUR MOUTH OUT, NO CHEWING GUM CANDY OR MINTS.     Take these medicines the morning of surgery with A SIP OF WATER: none   DO NOT TAKE ANY DIABETIC MEDICATIONS DAY OF YOUR SURGERY                               You may not have any metal on your body including hair pins and              piercings  Do not wear jewelry, make-up, lotions, powders or perfumes, deodorant             Do not wear nail polish on your fingernails.  Do not shave  48 hours prior to surgery.              Men may shave face and neck.   Do not bring valuables to the hospital. [place].  Contacts, dentures or bridgework may not be worn into surgery.  Leave suitcase in the car. After surgery it may be brought to your room.     Patients discharged the day of surgery will not be allowed to drive home. IF YOU ARE HAVING SURGERY AND GOING HOME THE SAME DAY, YOU MUST HAVE AN ADULT TO DRIVE YOU HOME AND BE WITH YOU FOR 24 HOURS. YOU MAY GO HOME BY  TAXI OR UBER OR ORTHERWISE, BUT AN ADULT MUST ACCOMPANY YOU HOME AND STAY WITH YOU FOR 24 HOURS.  Name and phone number of your driver:                Please read over the following fact sheets you were given: _____________________________________________________________________             [facility] - Preparing for Surgery Before surgery, you can play an important role.  Because skin is not sterile, your skin needs to be as free of germs as possible.  You can reduce the number of germs on your skin by washing with CHG (chlorahexidine gluconate) soap before surgery.  CHG is an antiseptic cleaner which kills germs and bonds with the skin to continue killing germs even after washing. Please DO NOT use if you have an allergy to  CHG or antibacterial soaps.  If your skin becomes reddened/irritated stop using the CHG and inform your nurse when you arrive at Short Stay. Do not shave (including legs and underarms) for at least 48 hours prior to the first CHG shower.  You may shave your face/neck. Please follow these instructions carefully:  1.  Shower with CHG Soap the night before surgery and the  morning of Surgery.  2.  If you choose to wash your hair, wash your hair first as usual with your  normal  shampoo.  3.  After you shampoo, rinse your hair and body thoroughly to remove the  shampoo.                           4.  Use CHG as you would any other liquid soap.  You can apply chg directly  to the skin and wash                       Gently with a scrungie or clean washcloth.  5.  Apply the CHG Soap to your body ONLY FROM THE NECK DOWN.   Do not use on face/ open                           Wound or open sores. Avoid contact with eyes, ears mouth and genitals (private parts).                       Wash face,  Genitals (private parts) with your normal soap.             6.  Wash thoroughly, paying special attention to the area where your surgery  will be performed.  7.  Thoroughly rinse your body  with warm water from the neck down.  8.  DO NOT shower/wash with your normal soap after using and rinsing off  the CHG Soap.                9.  Pat yourself dry with a clean towel.            10.  Wear clean pajamas.            11.  Place clean sheets on your bed the night of your first shower and do not  sleep with pets. Day of Surgery : Do not apply any lotions/deodorants the morning of surgery.  Please wear clean clothes to the hospital/surgery center.  FAILURE TO FOLLOW THESE INSTRUCTIONS MAY RESULT IN THE CANCELLATION OF YOUR SURGERY PATIENT SIGNATURE_________________________________  NURSE SIGNATURE__________________________________  ________________________________________________________________________

## 2020-01-15 NOTE — Progress Notes (Signed)
Covid vaccine series completed  PCP - Dr. Kirtland Bouchard. [place] last office visit 10/2019 or 11/2019 Cardiologist - N/A  Chest x-ray - greater than 1 year EKG - greater than 1 year Stress Test - greater than 2 years ECHO - N/A Cardiac Cath - N/A  Sleep Study - 06/06/2014 CPAP - No  Fasting Blood Sugar - N/A Checks Blood Sugar __N/A___ times a day  Blood Thinner Instructions:  N/A Aspirin Instructions: N/A Last Dose: N/A  Anesthesia review:  N/A  Patient denies shortness of breath, fever, cough and chest pain at PAT appointment   Patient verbalized understanding of instructions that were given to them at the PAT appointment. Patient was also instructed that they will need to review over the PAT instructions again at home before surgery.

## 2020-01-15 NOTE — Patient Instructions (Signed)
DUE TO COVID-19 ONLY TWO VISITORS ARE ALLOWED TO COME WITH YOU AND STAY IN THE WAITING ROOM ONLY DURING PRE OP AND PROCEDURE. THE TWO VISITORS MAY VISIT WITH YOU IN YOUR PRIVATE ROOM DURING VISITING HOURS ONLY!!   COVID SWAB TESTING MUST BE COMPLETED ON:  Friday, January 18, 2020 at  10:55 AM [address] [place] [facility] enter pre surgical testing line (Must self quarantine after testing. Follow instructions on handout.)             Your procedure is scheduled on: Tuesday, January 22, 2020   Report to [facility] Main  Entrance    Report to admitting at 7:45 AM   Call this number if you have problems the morning of surgery [phone]   Do not eat food or drink liquids :After Midnight.   Oral Hygiene is also important to reduce your risk of infection.                                    Remember - BRUSH YOUR TEETH THE MORNING OF SURGERY WITH YOUR REGULAR TOOTHPASTE   Do NOT smoke after Midnight   Take these medicines the morning of surgery with A SIP OF WATER: None                               You may not have any metal on your body including jewelry, and body piercings             Do not wear lotions, powders, perfumes/cologne, or deodorant                          Men may shave face and neck.   Do not bring valuables to the hospital. [facility] IS NOT             RESPONSIBLE   FOR VALUABLES.   Contacts, dentures or bridgework may not be worn into surgery.   Bring small overnight bag day of surgery.    Special Instructions: Bring a copy of your healthcare power of attorney and living will documents         the day of surgery if you haven't scanned them in before.              Please read over the following fact sheets you were given:  [facility] - Preparing for Surgery Before surgery, you can play an important role.  Because skin is not sterile, your skin needs to be as free of germs as possible.  You can reduce the number of germs on  your skin by washing with CHG (chlorahexidine gluconate) soap before surgery.  CHG is an antiseptic cleaner which kills germs and bonds with the skin to continue killing germs even after washing. Please DO NOT use if you have an allergy to CHG or antibacterial soaps.  If your skin becomes reddened/irritated stop using the CHG and inform your nurse when you arrive at Short Stay. Do not shave (including legs and underarms) for at least 48 hours prior to the first CHG shower.  You may shave your face/neck.  Please follow these instructions carefully:  1.  Shower with CHG Soap the night before surgery and the  morning of surgery.  2.  If you choose to wash your hair, wash  your hair first as usual with your normal  shampoo.  3.  After you shampoo, rinse your hair and body thoroughly to remove the shampoo.                             4.  Use CHG as you would any other liquid soap.  You can apply chg directly to the skin and wash.  Gently with a scrungie or clean washcloth.  5.  Apply the CHG Soap to your body ONLY FROM THE NECK DOWN.   Do   not use on face/ open                           Wound or open sores. Avoid contact with eyes, ears mouth and   genitals (private parts).                       Wash face,  Genitals (private parts) with your normal soap.             6.  Wash thoroughly, paying special attention to the area where your    surgery  will be performed.  7.  Thoroughly rinse your body with warm water from the neck down.  8.  DO NOT shower/wash with your normal soap after using and rinsing off the CHG Soap.                9.  Pat yourself dry with a clean towel.            10.  Wear clean pajamas.            11.  Place clean sheets on your bed the night of your first shower and do not  sleep with pets. Day of Surgery : Do not apply any lotions/deodorants the morning of surgery.  Please wear clean clothes to the hospital/surgery center.  FAILURE TO FOLLOW THESE INSTRUCTIONS MAY RESULT IN THE  CANCELLATION OF YOUR SURGERY  PATIENT SIGNATURE_________________________________  NURSE SIGNATURE__________________________________  ________________________________________________________________________

## 2020-01-18 ENCOUNTER — Other Ambulatory Visit: Payer: Self-pay

## 2020-01-18 ENCOUNTER — Other Ambulatory Visit (HOSPITAL_COMMUNITY)
Admission: RE | Admit: 2020-01-18 | Discharge: 2020-01-18 | Disposition: A | Discharge status: Home / Self Care | Payer: BC Managed Care – PPO | Source: Ambulatory Visit | Attending: Urology | Admitting: Urology

## 2020-01-18 ENCOUNTER — Encounter (HOSPITAL_COMMUNITY)
Admission: RE | Admit: 2020-01-18 | Discharge: 2020-01-18 | Disposition: A | Discharge status: Home / Self Care | Payer: BC Managed Care – PPO | Source: Ambulatory Visit | Attending: Urology | Admitting: Urology

## 2020-01-18 DIAGNOSIS — Z01812 Encounter for preprocedural laboratory examination: Secondary | ICD-10-CM | POA: Diagnosis present

## 2020-01-18 DIAGNOSIS — Z20822 Contact with and (suspected) exposure to covid-19: Secondary | ICD-10-CM | POA: Diagnosis not present

## 2020-01-18 LAB — BASIC METABOLIC PANEL
Anion gap: 11 (ref 5–15)
BUN: 12 mg/dL (ref 6–20)
CO2: 25 mmol/L (ref 22–32)
Calcium: 9.4 mg/dL (ref 8.9–10.3)
Chloride: 105 mmol/L (ref 98–111)
Creatinine, Ser: 0.97 mg/dL (ref 0.61–1.24)
GFR calc Af Amer: >60 mL/min (ref >60)
GFR calc non Af Amer: >60 mL/min (ref >60)
Glucose, Bld: 137 mg/dL — ABNORMAL HIGH (ref 70–99)
Potassium: 3.5 mmol/L (ref 3.5–5.1)
Sodium: 141 mmol/L (ref 135–145)

## 2020-01-18 LAB — CBC
HCT: 47.5 % (ref 39.0–52.0)
Hemoglobin: 16 g/dL (ref 13.0–17.0)
MCH: 28.7 pg (ref 26.0–34.0)
MCHC: 33.7 g/dL (ref 30.0–36.0)
MCV: 85.3 fL (ref 80.0–100.0)
Platelets: 198 10*3/uL (ref 150–400)
RBC: 5.57 MIL/uL (ref 4.22–5.81)
RDW: 12.4 % (ref 11.5–15.5)
WBC: 4.7 10*3/uL (ref 4.0–10.5)
nRBC: 0 % (ref 0.0–0.2)

## 2020-01-18 LAB — SARS CORONAVIRUS 2 (TAT 6-24 HRS): SARS Coronavirus 2: NEGATIVE

## 2020-01-20 ENCOUNTER — Other Ambulatory Visit: Payer: Self-pay | Admitting: Radiology

## 2020-01-21 MED ORDER — GENTAMICIN SULFATE 40 MG/ML IJ SOLN
5.0000 mg/kg | INTRAVENOUS | Status: AC
  Administered 2020-01-22: 11:00:00 420 mg via INTRAVENOUS
  Filled 2020-01-21: qty 10.5

## 2020-01-21 NOTE — H&P (Signed)
CC/HPI: cc: left kidney stone   10/01/19: 54 yo man with hx of nephrolithiasis now with left partial staghorn calculus seen on imaging 09/04/19. Patient reports passing stones in the past she. He has intermittent left back pain/groin pain that comes and goes. 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 He was recently treated for UTI. No hydro on imaging. No fever, chills, nausea or vomiting.   01/09/2020: Here for preoperative appointment prior to undergoing left PCNL on 04/13. Continues to have intermittent left lower back and flank pain/discomfort described as dull and aching in nature. Usually controlled with use of Tylenol and sometimes not occurring daily. Voiding symptoms remain stable with no interval passage of stone material, dysuria, visible blood in the urine. He has had no interval fevers or chills, nausea/vomiting. No changes to past medical history, prescription medications, or no recent surgical procedures. He has had no interval treatment for UTI or other infectious process. He is not on anticoagulation therapy.     ALLERGIES: Benadryl CAPS    MEDICATIONS: Hydrocodone-Acetaminophen  Vitamin C 500 mg tablet Oral     GU PSH: No GU PSH      PSH Notes: Eye Surgery   NON-GU PSH: No Non-GU PSH    GU PMH: Renal calculus (Stable), Left, I reviewed the CT scan with the patient and showed him is images. Patient has a left partial staghorn calculus that appears to become fries of 2 stones approximately 2 cm each. We discussed different management options including observation, ESWL, stage ureteroscopy and PCNL. Due to the size of the stone I have recommended a left PCNL with possible staged ureteroscopy after. We discussed the risks and benefits of a PCNL and patient would like to proceed sometime in the spring. As he is not symptomatic at the moment, no acute intervention. If obstructions develops I would favor PCN tube over stent so that it could be used for  access. - 10/02/2019, Nephrolithiasis, - 2017 Abdominal Pain Unspec, Left flank pain - 2017 Gross hematuria, Gross hematuria - 2017 Microscopic hematuria, Benign microscopic hematuria - 2017 Encounter for Prostate Cancer screening, Prostate cancer screening - 2014 History of urolithiasis, Nephrolithiasis - 2014    NON-GU PMH: Encounter for general adult medical examination without abnormal findings, Encounter for preventive health examination - 2017 Obstructive sleep apnea (adult) (pediatric), Obstructive sleep apnea, adult - 2017 Personal history of other diseases of the digestive system, History of gastric ulcer - 2017    FAMILY HISTORY: Acute Myocardial Infarction - Runs In Family Bladder Cancer - Runs In Family Hematuria - Runs In Family Hypertension - Runs In Family Kidney Stones - Runs In Family   SOCIAL HISTORY: Marital Status: Married Preferred Language: English; Ethnicity: Not Hispanic Or Latino; Race: Black or African American Current Smoking Status: Patient has never smoked.   Tobacco Use Assessment Completed: Used Tobacco in last 30 days? Has never drank.  Drinks 1 caffeinated drink per day.     Notes: Occupation, Married, No alcohol use, No caffeine use, Number of children, Never a smoker   REVIEW OF SYSTEMS:    GU Review Male:   Patient reports get up at night to urinate and have to strain to urinate . Patient denies frequent urination, hard to postpone urination, burning/ pain with urination, leakage of urine, stream starts and stops, trouble starting your stream, erection problems, and penile pain.  Gastrointestinal (Upper):   Patient denies nausea, vomiting, and indigestion/ heartburn.  Gastrointestinal (Lower):  Patient reports diarrhea and constipation.   Constitutional:   Patient denies fever, night sweats, weight loss, and fatigue.  Skin:   Patient denies skin rash/ lesion and itching.  Eyes:   Patient denies blurred vision and double vision.  Ears/ Nose/  Throat:   Patient denies sore throat and sinus problems.  Hematologic/Lymphatic:   Patient denies swollen glands and easy bruising.  Cardiovascular:   Patient denies leg swelling and chest pains.  Respiratory:   Patient denies cough and shortness of breath.  Endocrine:   Patient denies excessive thirst.  Musculoskeletal:   Patient reports back pain. Patient denies joint pain.  Neurological:   Patient denies headaches and dizziness.  Psychologic:   Patient denies depression and anxiety.   VITAL SIGNS:      01/09/2020 08:17 AM  Weight 227 lb / 102.97 kg  Height 70 in / 177.8 cm  BP 152/84 mmHg  Pulse 65 /min  Temperature 96.4 F / 35.7 C  BMI 32.6 kg/m   MULTI-SYSTEM PHYSICAL EXAMINATION:    Constitutional: Well-nourished. No physical deformities. Normally developed. Good grooming.  Neck: Neck symmetrical, not swollen. Normal tracheal position.  Respiratory: No labored breathing, no use of accessory muscles.   Cardiovascular: Normal temperature, normal extremity pulses, no swelling, no varicosities.  Skin: No paleness, no jaundice, no cyanosis. No lesion, no ulcer, no rash.  Neurologic / Psychiatric: Oriented to time, oriented to place, oriented to person. No depression, no anxiety, no agitation.  Gastrointestinal: No mass, no tenderness, no rigidity, non obese abdomen.  Musculoskeletal: Normal gait and station of head and neck.     PAST DATA REVIEWED:  Source Of History:  Patient, Medical Record Summary  Records Review:   Previous Hospital Records, Previous Patient Records  Urine Test Review:   Urinalysis   10/10/08  PSA  Total PSA 0.55     PROCEDURES:          Urinalysis w/Scope Dipstick Dipstick Cont'd Micro  Color: Yellow Bilirubin: Neg mg/dL WBC/hpf: 0 - 5/hpf  Appearance: Clear Ketones: Neg mg/dL RBC/hpf: 10 - 20/hpf  Specific Gravity: 1.025 Blood: 2+ ery/uL Bacteria: Few (10-25/hpf)  pH: 5.5 Protein: 1+ mg/dL Cystals: NS (Not Seen)  Glucose: Neg mg/dL Urobilinogen:  0.2 mg/dL Casts: NS (Not Seen)    Nitrites: Neg Trichomonas: Not Present    Leukocyte Esterase: Trace leu/uL Mucous: Present      Epithelial Cells: 0 - 5/hpf      Yeast: NS (Not Seen)      Sperm: Not Present    ASSESSMENT:      ICD-10 Details  1 GU:   Renal calculus - N20.0 Left, Chronic, Stable   PLAN:           Orders Labs Urine Culture          Schedule Return Visit/Planned Activity: Keep Scheduled Appointment - Follow up MD, Schedule Surgery          Document Letter(s):  Created for Patient: Clinical Summary         Notes:   All questions answered to the best my ability about upcoming procedure and expected postoperative course with understanding expressed by the patient. Preprocedural baseline urine culture will be sent today. He will proceed with planned left PCNL on 04/13 with his urologist.

## 2020-01-22 ENCOUNTER — Observation Stay (HOSPITAL_COMMUNITY)
Admission: RE | Admit: 2020-01-22 | Discharge: 2020-01-23 | Disposition: A | Discharge status: Home / Self Care | Payer: BC Managed Care – PPO | Source: Ambulatory Visit | Attending: Urology | Admitting: Urology

## 2020-01-22 ENCOUNTER — Ambulatory Visit (HOSPITAL_COMMUNITY): Payer: BC Managed Care – PPO

## 2020-01-22 ENCOUNTER — Ambulatory Visit (HOSPITAL_COMMUNITY)
Admission: RE | Admit: 2020-01-22 | Discharge: 2020-01-22 | Disposition: A | Discharge status: Home / Self Care | Payer: BC Managed Care – PPO | Source: Ambulatory Visit | Attending: Urology | Admitting: Urology

## 2020-01-22 ENCOUNTER — Encounter (HOSPITAL_COMMUNITY): Payer: Self-pay | Admitting: Urology

## 2020-01-22 ENCOUNTER — Ambulatory Visit (HOSPITAL_COMMUNITY): Payer: BC Managed Care – PPO | Admitting: Certified Registered Nurse Anesthetist

## 2020-01-22 ENCOUNTER — Other Ambulatory Visit: Payer: Self-pay

## 2020-01-22 ENCOUNTER — Encounter (HOSPITAL_COMMUNITY)
Admission: RE | Disposition: A | Discharge status: Home / Self Care | Payer: Self-pay | Source: Ambulatory Visit | Attending: Urology

## 2020-01-22 DIAGNOSIS — Z888 Allergy status to other drugs, medicaments and biological substances status: Secondary | ICD-10-CM | POA: Diagnosis not present

## 2020-01-22 DIAGNOSIS — Z8711 Personal history of peptic ulcer disease: Secondary | ICD-10-CM | POA: Insufficient documentation

## 2020-01-22 DIAGNOSIS — Z87442 Personal history of urinary calculi: Secondary | ICD-10-CM | POA: Insufficient documentation

## 2020-01-22 DIAGNOSIS — N200 Calculus of kidney: Principal | ICD-10-CM | POA: Insufficient documentation

## 2020-01-22 DIAGNOSIS — Z79899 Other long term (current) drug therapy: Secondary | ICD-10-CM | POA: Insufficient documentation

## 2020-01-22 HISTORY — PX: NEPHROLITHOTOMY: SHX5134

## 2020-01-22 HISTORY — PX: HOLMIUM LASER APPLICATION: SHX5852

## 2020-01-22 HISTORY — PX: IR URETERAL STENT LEFT NEW ACCESS W/O SEP NEPHROSTOMY CATH: IMG6075

## 2020-01-22 LAB — CBC WITH DIFFERENTIAL/PLATELET
Abs Immature Granulocytes: 0.01 10*3/uL (ref 0.00–0.07)
Basophils Absolute: 0 10*3/uL (ref 0.0–0.1)
Basophils Relative: 1 %
Eosinophils Absolute: 0.1 10*3/uL (ref 0.0–0.5)
Eosinophils Relative: 2 %
HCT: 45.9 % (ref 39.0–52.0)
Hemoglobin: 15.4 g/dL (ref 13.0–17.0)
Immature Granulocytes: 0 %
Lymphocytes Relative: 39 %
Lymphs Abs: 1.8 10*3/uL (ref 0.7–4.0)
MCH: 28.8 pg (ref 26.0–34.0)
MCHC: 33.6 g/dL (ref 30.0–36.0)
MCV: 85.8 fL (ref 80.0–100.0)
Monocytes Absolute: 0.4 10*3/uL (ref 0.1–1.0)
Monocytes Relative: 8 %
Neutro Abs: 2.3 10*3/uL (ref 1.7–7.7)
Neutrophils Relative %: 50 %
Platelets: 199 10*3/uL (ref 150–400)
RBC: 5.35 MIL/uL (ref 4.22–5.81)
RDW: 12.3 % (ref 11.5–15.5)
WBC: 4.5 10*3/uL (ref 4.0–10.5)
nRBC: 0 % (ref 0.0–0.2)

## 2020-01-22 LAB — PROTIME-INR
INR: 0.8 (ref 0.8–1.2)
Prothrombin Time: 11.4 seconds (ref 11.4–15.2)

## 2020-01-22 SURGERY — NEPHROLITHOTOMY PERCUTANEOUS
Anesthesia: General | Laterality: Left

## 2020-01-22 MED ORDER — IOHEXOL 300 MG/ML  SOLN
50.0000 mL | Freq: Once | INTRAMUSCULAR | Status: AC | PRN
  Administered 2020-01-22: 20 mL

## 2020-01-22 MED ORDER — LIDOCAINE HCL 1 % IJ SOLN
INTRAMUSCULAR | Status: AC
  Filled 2020-01-22: qty 20

## 2020-01-22 MED ORDER — FENTANYL CITRATE (PF) 100 MCG/2ML IJ SOLN
25.0000 ug | INTRAMUSCULAR | Status: DC | PRN
  Administered 2020-01-22 (×3): 50 ug via INTRAVENOUS

## 2020-01-22 MED ORDER — ACETAMINOPHEN 325 MG PO TABS
650.0000 mg | ORAL_TABLET | ORAL | Status: DC | PRN
  Administered 2020-01-22 – 2020-01-23 (×2): 650 mg via ORAL
  Filled 2020-01-22 (×2): qty 2

## 2020-01-22 MED ORDER — MIDAZOLAM HCL 5 MG/5ML IJ SOLN
INTRAMUSCULAR | Status: DC | PRN
  Administered 2020-01-22: 2 mg via INTRAVENOUS

## 2020-01-22 MED ORDER — DEXAMETHASONE SODIUM PHOSPHATE 10 MG/ML IJ SOLN
INTRAMUSCULAR | Status: AC
  Filled 2020-01-22: qty 1

## 2020-01-22 MED ORDER — OXYBUTYNIN CHLORIDE 5 MG PO TABS
5.0000 mg | ORAL_TABLET | Freq: Three times a day (TID) | ORAL | Status: DC | PRN
  Administered 2020-01-22 – 2020-01-23 (×2): 5 mg via ORAL
  Filled 2020-01-22 (×2): qty 1

## 2020-01-22 MED ORDER — FENTANYL CITRATE (PF) 250 MCG/5ML IJ SOLN
INTRAMUSCULAR | Status: AC
  Filled 2020-01-22: qty 5

## 2020-01-22 MED ORDER — PROPOFOL 10 MG/ML IV BOLUS
INTRAVENOUS | Status: AC
  Filled 2020-01-22: qty 20

## 2020-01-22 MED ORDER — MIDAZOLAM HCL 2 MG/2ML IJ SOLN
INTRAMUSCULAR | Status: AC
  Filled 2020-01-22: qty 4

## 2020-01-22 MED ORDER — LACTATED RINGERS IV SOLN: INTRAVENOUS | Status: DC

## 2020-01-22 MED ORDER — FENTANYL CITRATE (PF) 100 MCG/2ML IJ SOLN
INTRAMUSCULAR | Status: AC | PRN
  Administered 2020-01-22: 50 ug via INTRAVENOUS
  Administered 2020-01-22: 25 ug via INTRAVENOUS
  Administered 2020-01-22: 50 ug via INTRAVENOUS

## 2020-01-22 MED ORDER — OXYCODONE HCL 5 MG PO TABS
5.0000 mg | ORAL_TABLET | ORAL | Status: DC | PRN
  Administered 2020-01-22 – 2020-01-23 (×2): 5 mg via ORAL
  Filled 2020-01-22 (×2): qty 1

## 2020-01-22 MED ORDER — FENTANYL CITRATE (PF) 100 MCG/2ML IJ SOLN
INTRAMUSCULAR | Status: DC | PRN
  Administered 2020-01-22: 100 ug via INTRAVENOUS

## 2020-01-22 MED ORDER — MIDAZOLAM HCL 2 MG/2ML IJ SOLN
INTRAMUSCULAR | Status: AC | PRN
  Administered 2020-01-22: 0.5 mg via INTRAVENOUS
  Administered 2020-01-22: 1 mg via INTRAVENOUS
  Administered 2020-01-22: 0.5 mg via INTRAVENOUS
  Administered 2020-01-22 (×2): 1 mg via INTRAVENOUS

## 2020-01-22 MED ORDER — FENTANYL CITRATE (PF) 100 MCG/2ML IJ SOLN
INTRAMUSCULAR | Status: AC
  Filled 2020-01-22: qty 2

## 2020-01-22 MED ORDER — PHENYLEPHRINE HCL-NACL 10-0.9 MG/250ML-% IV SOLN
INTRAVENOUS | Status: DC | PRN
  Administered 2020-01-22: 35 ug/min via INTRAVENOUS

## 2020-01-22 MED ORDER — MIDAZOLAM HCL 2 MG/2ML IJ SOLN
INTRAMUSCULAR | Status: AC
  Filled 2020-01-22: qty 2

## 2020-01-22 MED ORDER — CEFAZOLIN SODIUM-DEXTROSE 2-4 GM/100ML-% IV SOLN
INTRAVENOUS | Status: AC
  Administered 2020-01-22: 2000 mg
  Filled 2020-01-22: qty 100

## 2020-01-22 MED ORDER — SODIUM CHLORIDE 0.9 % IV SOLN
INTRAVENOUS | Status: DC | PRN
  Administered 2020-01-22: 50 mL

## 2020-01-22 MED ORDER — MORPHINE SULFATE (PF) 2 MG/ML IV SOLN
2.0000 mg | INTRAVENOUS | Status: DC | PRN
  Administered 2020-01-22 – 2020-01-23 (×4): 2 mg via INTRAVENOUS
  Filled 2020-01-22 (×4): qty 1

## 2020-01-22 MED ORDER — SENNA 8.6 MG PO TABS
1.0000 | ORAL_TABLET | Freq: Two times a day (BID) | ORAL | Status: DC
  Administered 2020-01-22 – 2020-01-23 (×2): 8.6 mg via ORAL
  Filled 2020-01-22 (×2): qty 1

## 2020-01-22 MED ORDER — ACETAMINOPHEN 500 MG PO TABS
1000.0000 mg | ORAL_TABLET | Freq: Once | ORAL | Status: AC
  Filled 2020-01-22: qty 2

## 2020-01-22 MED ORDER — ONDANSETRON HCL 4 MG/2ML IJ SOLN
INTRAMUSCULAR | Status: DC | PRN
  Administered 2020-01-22: 4 mg via INTRAVENOUS

## 2020-01-22 MED ORDER — LIDOCAINE 2% (20 MG/ML) 5 ML SYRINGE
INTRAMUSCULAR | Status: AC
  Filled 2020-01-22: qty 5

## 2020-01-22 MED ORDER — ROCURONIUM BROMIDE 50 MG/5ML IV SOSY
PREFILLED_SYRINGE | INTRAVENOUS | Status: DC | PRN
  Administered 2020-01-22: 100 mg via INTRAVENOUS
  Administered 2020-01-22: 20 mg via INTRAVENOUS

## 2020-01-22 MED ORDER — SODIUM CHLORIDE 0.9 % IR SOLN
Status: DC | PRN
  Administered 2020-01-22: 18000 mL

## 2020-01-22 MED ORDER — CEFAZOLIN SODIUM-DEXTROSE 2-4 GM/100ML-% IV SOLN
2.0000 g | Freq: Three times a day (TID) | INTRAVENOUS | Status: DC
  Administered 2020-01-22 – 2020-01-23 (×3): 2 g via INTRAVENOUS
  Filled 2020-01-22 (×4): qty 100

## 2020-01-22 MED ORDER — DOCUSATE SODIUM 100 MG PO CAPS
100.0000 mg | ORAL_CAPSULE | Freq: Two times a day (BID) | ORAL | Status: DC
  Administered 2020-01-22 – 2020-01-23 (×2): 100 mg via ORAL
  Filled 2020-01-22 (×2): qty 1

## 2020-01-22 MED ORDER — ROCURONIUM BROMIDE 10 MG/ML (PF) SYRINGE
PREFILLED_SYRINGE | INTRAVENOUS | Status: AC
  Filled 2020-01-22: qty 10

## 2020-01-22 MED ORDER — SUGAMMADEX SODIUM 200 MG/2ML IV SOLN
INTRAVENOUS | Status: DC | PRN
  Administered 2020-01-22: 400 mg via INTRAVENOUS

## 2020-01-22 MED ORDER — CEFAZOLIN SODIUM-DEXTROSE 2-4 GM/100ML-% IV SOLN: 2.0000 g | INTRAVENOUS | Status: DC

## 2020-01-22 MED ORDER — ACETAMINOPHEN 500 MG PO TABS
ORAL_TABLET | ORAL | Status: AC
  Administered 2020-01-22: 1000 mg via ORAL
  Filled 2020-01-22: qty 1

## 2020-01-22 MED ORDER — FENTANYL CITRATE (PF) 100 MCG/2ML IJ SOLN
INTRAMUSCULAR | Status: AC
  Filled 2020-01-22: qty 4

## 2020-01-22 MED ORDER — ONDANSETRON HCL 4 MG/2ML IJ SOLN
INTRAMUSCULAR | Status: AC
  Filled 2020-01-22: qty 2

## 2020-01-22 MED ORDER — ONDANSETRON HCL 4 MG/2ML IJ SOLN: 4.0000 mg | INTRAMUSCULAR | Status: DC | PRN

## 2020-01-22 MED ORDER — FENTANYL CITRATE (PF) 100 MCG/2ML IJ SOLN: 25.0000 ug | INTRAMUSCULAR | Status: DC | PRN

## 2020-01-22 MED ORDER — DEXTROSE-NACL 5-0.45 % IV SOLN: INTRAVENOUS | Status: DC

## 2020-01-22 MED ORDER — 0.9 % SODIUM CHLORIDE (POUR BTL) OPTIME
TOPICAL | Status: DC | PRN
  Administered 2020-01-22: 1000 mL

## 2020-01-22 MED ORDER — SUGAMMADEX SODIUM 500 MG/5ML IV SOLN
INTRAVENOUS | Status: AC
  Filled 2020-01-22: qty 5

## 2020-01-22 MED ORDER — LIDOCAINE 2% (20 MG/ML) 5 ML SYRINGE
INTRAMUSCULAR | Status: DC | PRN
  Administered 2020-01-22: 100 mg via INTRAVENOUS

## 2020-01-22 MED ORDER — DEXAMETHASONE SODIUM PHOSPHATE 10 MG/ML IJ SOLN
INTRAMUSCULAR | Status: DC | PRN
  Administered 2020-01-22: 10 mg via INTRAVENOUS

## 2020-01-22 MED ORDER — PROPOFOL 10 MG/ML IV BOLUS
INTRAVENOUS | Status: DC | PRN
  Administered 2020-01-22: 150 mg via INTRAVENOUS

## 2020-01-22 MED ORDER — LIDOCAINE HCL (PF) 1 % IJ SOLN
INTRAMUSCULAR | Status: AC | PRN
  Administered 2020-01-22 (×2): 10 mL via INTRADERMAL

## 2020-01-22 SURGICAL SUPPLY — 58 items
ADAPTER GOLDBERG URETERAL (ADAPTER) IMPLANT
BAG URINE DRAIN 2000ML AR STRL (UROLOGICAL SUPPLIES) ×3 IMPLANT
BASKET LASER NITINOL 1.9FR (BASKET) ×3 IMPLANT
BASKET STONE 1.7 NGAGE (UROLOGICAL SUPPLIES) ×2 IMPLANT
BASKET STONE NITINOL 3FRX115MB (UROLOGICAL SUPPLIES) IMPLANT
BENZOIN TINCTURE PRP APPL 2/3 (GAUZE/BANDAGES/DRESSINGS) ×3 IMPLANT
BLADE SURG 15 STRL LF DISP TIS (BLADE) ×1 IMPLANT
BLADE SURG 15 STRL SS (BLADE) ×3
BOOTIES KNEE HIGH SLOAN (MISCELLANEOUS) ×3 IMPLANT
CATH FOLEY 2W COUNCIL 20FR 5CC (CATHETERS) ×2 IMPLANT
CATH FOLEY 2WAY SLVR  5CC 18FR (CATHETERS) ×3
CATH FOLEY 2WAY SLVR 5CC 18FR (CATHETERS) ×1 IMPLANT
CATH IMAGER II 65CM (CATHETERS) IMPLANT
CATH ROBINSON RED A/P 20FR (CATHETERS) IMPLANT
CATH URET 5FR 28IN OPEN ENDED (CATHETERS) ×2 IMPLANT
CATH URET DUAL LUMEN 6-10FR 50 (CATHETERS) ×3 IMPLANT
CATH UROLOGY TORQUE 40 (MISCELLANEOUS) IMPLANT
CATH X-FORCE N30 NEPHROSTOMY (TUBING) ×3 IMPLANT
CHLORAPREP W/TINT 26 (MISCELLANEOUS) ×3 IMPLANT
COVER SURGICAL LIGHT HANDLE (MISCELLANEOUS) ×3 IMPLANT
COVER WAND RF STERILE (DRAPES) IMPLANT
DEVICE INFLATION 20CC 30ATM (MISCELLANEOUS) ×2 IMPLANT
DRAPE C-ARM 42X120 X-RAY (DRAPES) ×3 IMPLANT
DRAPE LINGEMAN PERC (DRAPES) ×3 IMPLANT
DRAPE SURG IRRIG POUCH 19X23 (DRAPES) ×3 IMPLANT
DRSG PAD ABDOMINAL 8X10 ST (GAUZE/BANDAGES/DRESSINGS) ×4 IMPLANT
DRSG TEGADERM 8X12 (GAUZE/BANDAGES/DRESSINGS) ×4 IMPLANT
FIBER LASER FLEXIVA 365 (UROLOGICAL SUPPLIES) ×1 IMPLANT
FIBER LASER TRAC TIP (UROLOGICAL SUPPLIES) ×3 IMPLANT
GAUZE SPONGE 4X4 12PLY STRL (GAUZE/BANDAGES/DRESSINGS) ×3 IMPLANT
GLOVE BIO SURGEON STRL SZ 6.5 (GLOVE) ×2 IMPLANT
GLOVE BIO SURGEONS STRL SZ 6.5 (GLOVE) ×1
GOWN STRL REUS W/TWL LRG LVL3 (GOWN DISPOSABLE) ×3 IMPLANT
GUIDEWIRE AMPLAZ .035X145 (WIRE) ×3 IMPLANT
GUIDEWIRE SENSOR ANG DUAL FLEX (WIRE) ×3 IMPLANT
GUIDEWIRE STR DUAL SENSOR (WIRE) ×2 IMPLANT
KIT BASIN OR (CUSTOM PROCEDURE TRAY) ×3 IMPLANT
KIT PROBE 340X3.4XDISP GRN (MISCELLANEOUS) IMPLANT
KIT PROBE TRILOGY 3.4X340 (MISCELLANEOUS)
KIT PROBE TRILOGY 3.9X350 (MISCELLANEOUS) ×2 IMPLANT
KIT TURNOVER KIT A (KITS) IMPLANT
MANIFOLD NEPTUNE II (INSTRUMENTS) ×3 IMPLANT
NS IRRIG 1000ML POUR BTL (IV SOLUTION) ×3 IMPLANT
PACK CYSTO (CUSTOM PROCEDURE TRAY) ×3 IMPLANT
SCOPE LITHOVU DISP 9.5FR 7.7FR (UROLOGICAL SUPPLIES) IMPLANT
SCOPE LITHOVUE DISPOSABLE (UROLOGICAL SUPPLIES) ×3
SPONGE LAP 4X18 RFD (DISPOSABLE) ×3 IMPLANT
STENT URET 6FRX28 CONTOUR (STENTS) ×2 IMPLANT
SUT SILK 2 0 30  PSL (SUTURE)
SUT SILK 2 0 30 PSL (SUTURE) IMPLANT
SYR 20ML LL LF (SYRINGE) ×3 IMPLANT
TOWEL OR 17X26 10 PK STRL BLUE (TOWEL DISPOSABLE) ×3 IMPLANT
TRAY FOLEY MTR SLVR 16FR STAT (SET/KITS/TRAYS/PACK) ×3 IMPLANT
TUBING CONNECTING 10 (TUBING) ×2 IMPLANT
TUBING CONNECTING 10' (TUBING) ×1
TUBING STONE CATCHER TRILOGY (MISCELLANEOUS) ×2 IMPLANT
TUBING UROLOGY SET (TUBING) ×3 IMPLANT
WATER STERILE IRR 1000ML POUR (IV SOLUTION) ×3 IMPLANT

## 2020-01-22 NOTE — Transfer of Care (Signed)
Immediate Anesthesia Transfer of Care Note  Patient: Daniel Deleon  Procedure(s) Performed: NEPHROLITHOTOMY PERCUTANEOUS (Left ) HOLMIUM LASER APPLICATION (Left )  Patient Location: PACU  Anesthesia Type:General  Level of Consciousness: awake, alert  and oriented  Airway & Oxygen Therapy: Patient Spontanous Breathing and Patient connected to face mask oxygen  Post-op Assessment: Report given to RN and Post -op Vital signs reviewed and stable  Post vital signs: Reviewed and stable  Last Vitals:  Vitals Value Taken Time  BP 157/91 01/22/20 1339  Temp    Pulse 84 01/22/20 1342  Resp 17 01/22/20 1342  SpO2 91 % 01/22/20 1342  Vitals shown include unvalidated device data.  Last Pain:  Vitals:   01/22/20 1059  PainSc: 3          Complications: No apparent anesthesia complications

## 2020-01-22 NOTE — Op Note (Signed)
Operative Note  Preoperative diagnosis:  1. LEFT Renal calculus  Postoperative diagnosis: 1. LEFT Renal calculus   Procedure(s): 1. LEFT Percutaneous nephrolithotomy  Surgeon: Kasandra Knudsen, MD  Assistants: none  Anesthesia: General  Complications: None immediate  EBL: 25 cc  Specimens: 1.  Renal calculus  Drains/Catheters: 1.  6 x28Fr JJ ureteral stent 2. 20Fr council tip foley as PCN tube 3. 16Fr foley catheter  Intraoperative findings:  1. Large renal pelvis stone and 2 lower pole calculi 2.   Indication: 54 year old man with a large partial staghorn renal calculus presents for the previously mentioned operation.  Description of procedure:  The patient was identified and consent was obtained.  The patient was taken to the operating room and placed in the supine position.  The patient was placed under general anesthesia.  Perioperative antibiotics were administered.  The patient was placed in prone position and all pressure points were padded.  Patient was prepped and draped in a standard sterile fashion and a timeout was performed.  A Super Stiff wire was advanced through the nephroureteral stent down to the bladder under fluoroscopic guidance and the nephroureteral stent was removed.  A dual-lumen ureteral catheter was advanced over the Super Stiff wire into the renal pelvis and an antegrade nephrostogram was performed.  This showed a well opacified kidney and a filling defect corresponding to the stone of interest.  I advanced the dual-lumen ureteral catheter into the proximal ureter under fluoroscopic guidance followed by placement of a second Super Stiff wire down to the bladder under fluoroscopic guidance.  The dual-lumen catheter was removed.  An incision was made alongside the wires.  The balloon dilator was then advanced over one of the wires and into the renal pelvis fluoroscopic guidance and the tract was dilated to a pressure of 18.  The sheath was advanced over  the balloon and into the renal pelvis.  The balloon was withdrawn keeping the sheath in place.  The nephroscope was advanced into the kidney and the stone of interest was encountered in the renal pelvis.  The stone was then removed with a combination of pneumatic and ultrasound with suction.  Next, attention turned to the lower pole.  Due to the access to the kidney, the nephroscope was unable to make turn to the lower pole.  Attempts with cystoscope and ureterscope were tried.  The stone was visible but inaccessible with the scope and laser due to the degree of flexion.      A 6 x 28double-J ureteral stent was advanced over 1 of the wires under fluoroscopic guidance and the wire was withdrawn.  Fluoroscopy confirmed a good coil within the bladder as well as a good coil in the renal pelvis proximally.  A 20 [place] council tip catheter was advanced over the wire and 2 cc of contrast was instilled into the catheter balloon.  The council tip catheter was secured down with a silk suture.  This concluded the operation.  The patient tolerated procedure well and was stable postoperatively.  Plan: Patient will remain under observation overnight. Remaining stone burden to be discussed with patient.

## 2020-01-22 NOTE — Anesthesia Preprocedure Evaluation (Signed)
Anesthesia Evaluation  Patient identified by MRN, date of birth, ID band Patient awake    Reviewed: Allergy & Precautions, NPO status , Patient's Chart, lab work & pertinent test results  Airway Mallampati: III  TM Distance: >3 FB Neck ROM: Full  Mouth opening: Limited Mouth Opening  Dental  (+) Chipped, Dental Advisory Given,    Pulmonary sleep apnea (does not use CPAP) ,    Pulmonary exam normal breath sounds clear to auscultation       Cardiovascular negative cardio ROS Normal cardiovascular exam Rhythm:Regular Rate:Normal     Neuro/Psych negative neurological ROS  negative psych ROS   GI/Hepatic negative GI ROS, Neg liver ROS,   Endo/Other  negative endocrine ROS  Renal/GU negative Renal ROS  negative genitourinary   Musculoskeletal negative musculoskeletal ROS (+)   Abdominal   Peds  Hematology negative hematology ROS (+)   Anesthesia Other Findings   Reproductive/Obstetrics                             Anesthesia Physical Anesthesia Plan  ASA: II  Anesthesia Plan: General   Post-op Pain Management:    Induction: Intravenous  PONV Risk Score and Plan: 2 and Midazolam, Dexamethasone and Ondansetron  Airway Management Planned: Oral ETT  Additional Equipment:   Intra-op Plan:   Post-operative Plan: Extubation in OR  Informed Consent: I have reviewed the patients History and Physical, chart, labs and discussed the procedure including the risks, benefits and alternatives for the proposed anesthesia with the patient or authorized representative who has indicated his/her understanding and acceptance.     Dental advisory given  Plan Discussed with: CRNA  Anesthesia Plan Comments:         Anesthesia Quick Evaluation

## 2020-01-22 NOTE — H&P (Signed)
Chief Complaint: Patient was seen in consultation today for left PCN placement.  Referring Physician(s): Pace,Maryellen D  Supervising Physician: Gilmer Mor  Patient Status: [facility] - Out-pt  History of Present Illness: Daniel Deleon is a 54 y.o. male with a past medical history significant for sleep apnea, spinal stenosis, bulging lumbar discs and nephrolithiasis who presents today for a left PCN placement prior to percutaneous nephrolithotomy today with Dr. Arita Miss. Mr. Toral has a history of nephrolithiasis and has previously passed stones. He began to experience LLQ abdominal pain/cramping as well as left groin/flank pain in March of 2020 following a colonoscopy and a CT abd/pelvis w/contrast was obtained by his PCP on 09/05/19 for further evaluation. Imaging showed interval development of a nonobstructive but enlarging staghorn calculus in the left renal pelvis with urothelial thickening and parapelvic stranding suggesting infection and/or inflammation. He was referred to urology and plan was made to proceed with percutaneous nephrolithotomy. IR has been asked to perform a left percutaneous nephrostomy for access to perform nephrolithotomy later today.  Mr. Calabrese reports intermittent left flank and left groin pain that feels like a squeezing sensation, he also reports left sided back issues which are chronic and unchanged recently. He understands that he will first have the PCN placed in IR followed by nephrolithotomy in the OR with urology and is agreeable to this plan. He is wondering if the tube will hurt when it comes out.   Past Medical History:  Diagnosis Date  . Bulging lumbar disc    L4-L5, L5-S1  . History of kidney stones   . History of migraine   . Pneumonia    Childhood  . Sleep apnea    not currently using CPAP has had weight loss  . Spinal stenosis     Past Surgical History:  Procedure Laterality Date  . COLONOSCOPY  12/2018  . EYE SURGERY  1977   Cyst  removed- 45-86 year old, left eye  . QUADRICEPS TENDON REPAIR Left 05/2018    Allergies: Other, Diphenhydramine hcl, Mushroom extract complex, and Peanut-containing drug products  Medications: Prior to Admission medications   Medication Sig Start Date End Date Taking? Authorizing Provider  acetaminophen (TYLENOL) 500 MG tablet Take 500 mg by mouth every 6 (six) hours as needed for mild pain.    [provider]  ondansetron (ZOFRAN ODT) 4 MG disintegrating tablet 4mg  ODT q4 hours prn nausea/vomit Patient not taking: Reported on 01/09/2020 03/15/15   Muthersbaugh, 05/15/15, PA-C  oxyCODONE-acetaminophen (PERCOCET) 5-325 MG per tablet Take 1-2 tablets by mouth every 4 (four) hours as needed. Patient not taking: Reported on 01/09/2020 03/15/15   Muthersbaugh, 05/15/15, PA-C  tamsulosin (FLOMAX) 0.4 MG CAPS capsule Take 1 capsule (0.4 mg total) by mouth 2 (two) times daily. Patient not taking: Reported on 01/09/2020 03/15/15   Muthersbaugh, 05/15/15, PA-C  vitamin C (ASCORBIC ACID) 250 MG tablet Take 250 mg by mouth 2 (two) times a week.    [provider]     Family History  Problem Relation Age of Onset  . Bladder Cancer Father   . Hypertension Father   . Diabetes Father   . Hypertension Brother     Social History   Socioeconomic History  . Marital status: Married    Spouse name: Dahlia Client  . Number of children: 3  . Years of education: Grad sch.  . Highest education level: Not on file  Occupational History  . Not on file  Tobacco Use  . Smoking status:  Never Smoker  . Smokeless tobacco: Never Used  Substance and Sexual Activity  . Alcohol use: No  . Drug use: No  . Sexual activity: Not on file  Other Topics Concern  . Not on file  Social History Narrative   Patient is married [name] and lives at home with his wife and three children.   Patient works full-time at [employer].   Patient has a college education Market researcher school).   Patient is ambi-dextrous.    Patient drinks one soda daily.   Social Determinants of Health   Financial Resource Strain:   . Difficulty of Paying Living Expenses:   Food Insecurity:   . Worried About Programme researcher, broadcasting/film/video in the Last Year:   . Barista in the Last Year:   Transportation Needs:   . Freight forwarder (Medical):   Marland Kitchen Lack of Transportation (Non-Medical):   Physical Activity:   . Days of Exercise per Week:   . Minutes of Exercise per Session:   Stress:   . Feeling of Stress :   Social Connections:   . Frequency of Communication with Friends and Family:   . Frequency of Social Gatherings with Friends and Family:   . Attends Religious Services:   . Active Member of Clubs or Organizations:   . Attends Banker Meetings:   Marland Kitchen Marital Status:      Review of Systems: A 12 point ROS discussed and pertinent positives are indicated in the HPI above.  All other systems are negative.  Review of Systems  Constitutional: Negative for chills and fever.  Respiratory: Negative for cough and shortness of breath.   Gastrointestinal: Negative for abdominal pain, blood in stool, diarrhea, nausea and vomiting.  Genitourinary: Positive for flank pain (left, intermittent). Negative for dysuria and hematuria.  Musculoskeletal: Positive for back pain.  Neurological: Negative for dizziness and headaches.    Vital Signs: There were no vitals taken for this visit.  Physical Exam Vitals reviewed.  Constitutional:      General: He is not in acute distress. HENT:     Head: Normocephalic.     Mouth/Throat:     Mouth: Mucous membranes are moist.     Pharynx: Oropharynx is clear. No oropharyngeal exudate or posterior oropharyngeal erythema.  Cardiovascular:     Rate and Rhythm: Normal rate and regular rhythm.  Pulmonary:     Effort: Pulmonary effort is normal.     Breath sounds: Normal breath sounds.  Abdominal:     General: There is no distension.     Palpations: Abdomen is soft.   Skin:    General: Skin is warm and dry.  Neurological:     Mental Status: He is alert and oriented to person, place, and time.  Psychiatric:        Mood and Affect: Mood normal.        Behavior: Behavior normal.        Thought Content: Thought content normal.        Judgment: Judgment normal.      MD Evaluation Airway: WNL Heart: WNL Abdomen: WNL Chest/ Lungs: WNL ASA  Classification: 2 Mallampati/Airway Score: Two   Imaging: No results found.  Labs:  CBC: Recent Labs    01/18/20 1036  WBC 4.7  HGB 16.0  HCT 47.5  PLT 198    COAGS: No results for input(s): INR, APTT in the last 8760 hours.  BMP: Recent Labs    01/18/20 1036  NA 141  K 3.5  CL 105  CO2 25  GLUCOSE 137*  BUN 12  CALCIUM 9.4  CREATININE 0.97  GFRNONAA >60  GFRAA >60    LIVER FUNCTION TESTS: No results for input(s): BILITOT, AST, ALT, ALKPHOS, PROT, ALBUMIN in the last 8760 hours.  TUMOR MARKERS: No results for input(s): AFPTM, CEA, CA199, CHROMGRNA in the last 8760 hours.  Assessment and Plan:  54 y/o M with history of nephrolithiasis found to have a left staghorn calculus late last year. He has been followed by urology and decision was made to proceed with left nephrolithotomy today. IR has been asked to perform a percutaneous nephrostomy for procedure access.   Patient has been NPO since midnight, he does not take any blood thinning medications. Afebrile, WBC 4.5, hgb 15.4, plt 199, INR 0.8.  Risks and benefits of left PCN placement was discussed with the patient including, but not limited to, infection, bleeding, significant bleeding causing loss or decrease in renal function or damage to adjacent structures.   All of the patient's questions were answered, patient is agreeable to proceed.  Consent signed and in chart.  Thank you for this interesting consult.  I greatly enjoyed meeting BRAYDIN ALOI Deleon and look forward to participating in their care.  A copy of this  report was sent to the requesting provider on this date.  Electronically Signed: Joaquim Nam, PA-C 01/22/2020, 8:28 AM   I spent a total of  30 Minutes in face to face in clinical consultation, greater than 50% of which was counseling/coordinating care for left PCN.

## 2020-01-22 NOTE — Procedures (Signed)
Interventional Radiology Procedure Note  Procedure:   Image guided left nepho-ureteral access for forthcoming antegrade nephrolithotomy.   Placement of a 65cm 32F catheter via mid-pole, posterior calyx.  Catheter looped in bladder and will accept any 035wire.     .  Complications: None  Recommendations:  - NPO - Do not submerge - for OR with Urology   Signed,  Yvone Neu. Loreta Ave, DO

## 2020-01-22 NOTE — Discharge Instructions (Signed)

## 2020-01-22 NOTE — Anesthesia Procedure Notes (Signed)
Procedure Name: Intubation Date/Time: 01/22/2020 11:16 AM Performed by: Maxwell Caul, CRNA Pre-anesthesia Checklist: Patient identified, Emergency Drugs available, Suction available and Patient being monitored Patient Re-evaluated:Patient Re-evaluated prior to induction Oxygen Delivery Method: Circle system utilized Preoxygenation: Pre-oxygenation with 100% oxygen Induction Type: IV induction Ventilation: Mask ventilation without difficulty and Oral airway inserted - appropriate to patient size Laryngoscope Size: Mac and 4 Grade View: Grade I Tube type: Oral Number of attempts: 1 Airway Equipment and Method: Stylet and Oral airway Placement Confirmation: ETT inserted through vocal cords under direct vision,  positive ETCO2 and breath sounds checked- equal and bilateral Secured at: 22 cm Tube secured with: Tape Dental Injury: Teeth and Oropharynx as per pre-operative assessment

## 2020-01-22 NOTE — Interval H&P Note (Signed)
History and Physical Interval Note:  01/22/2020 8:21 AM  Daniel Deleon  has presented today for surgery, with the diagnosis of LEFT RENAL CALCULUS.  The various methods of treatment have been discussed with the patient and family. After consideration of risks, benefits and other options for treatment, the patient has consented to  Procedure(s) with comments: NEPHROLITHOTOMY PERCUTANEOUS (Left) - 2 HRS HOLMIUM LASER APPLICATION (Left) as a surgical intervention.  The patient's history has been reviewed, patient examined, no change in status, stable for surgery.  I have reviewed the patient's chart and labs.  Questions were answered to the patient's satisfaction.     Daniel Deleon D Daniel Deleon

## 2020-01-23 ENCOUNTER — Encounter: Payer: Self-pay | Admitting: *Deleted

## 2020-01-23 DIAGNOSIS — N200 Calculus of kidney: Secondary | ICD-10-CM | POA: Diagnosis not present

## 2020-01-23 LAB — BASIC METABOLIC PANEL
Anion gap: 10 (ref 5–15)
BUN: 14 mg/dL (ref 6–20)
CO2: 27 mmol/L (ref 22–32)
Calcium: 9.3 mg/dL (ref 8.9–10.3)
Chloride: 104 mmol/L (ref 98–111)
Creatinine, Ser: 1.11 mg/dL (ref 0.61–1.24)
GFR calc Af Amer: >60 mL/min (ref >60)
GFR calc non Af Amer: >60 mL/min (ref >60)
Glucose, Bld: 151 mg/dL — ABNORMAL HIGH (ref 70–99)
Potassium: 4.2 mmol/L (ref 3.5–5.1)
Sodium: 141 mmol/L (ref 135–145)

## 2020-01-23 LAB — CBC
HCT: 42.1 % (ref 39.0–52.0)
Hemoglobin: 14 g/dL (ref 13.0–17.0)
MCH: 29.5 pg (ref 26.0–34.0)
MCHC: 33.3 g/dL (ref 30.0–36.0)
MCV: 88.8 fL (ref 80.0–100.0)
Platelets: 190 10*3/uL (ref 150–400)
RBC: 4.74 MIL/uL (ref 4.22–5.81)
RDW: 12.2 % (ref 11.5–15.5)
WBC: 13.9 10*3/uL — ABNORMAL HIGH (ref 4.0–10.5)
nRBC: 0 % (ref 0.0–0.2)

## 2020-01-23 MED ORDER — SENNOSIDES-DOCUSATE SODIUM 8.6-50 MG PO TABS: 2.0000 | ORAL_TABLET | Freq: Every day | ORAL | 1 refills | Status: DC | PRN

## 2020-01-23 MED ORDER — OXYBUTYNIN CHLORIDE 5 MG PO TABS: 5.0000 mg | ORAL_TABLET | Freq: Three times a day (TID) | ORAL | 0 refills | Status: DC | PRN

## 2020-01-23 MED ORDER — TAMSULOSIN HCL 0.4 MG PO CAPS: 0.4000 mg | ORAL_CAPSULE | Freq: Every day | ORAL | 0 refills | Status: DC

## 2020-01-23 MED ORDER — OXYCODONE-ACETAMINOPHEN 5-325 MG PO TABS: 1.0000 | ORAL_TABLET | Freq: Four times a day (QID) | ORAL | 0 refills | Status: AC | PRN

## 2020-01-23 NOTE — Progress Notes (Signed)
Pt called RN to room due to back dressing where nephrostomy tube was located was draining heavily. Dressing saturated and patient having drainage drip onto floor. RN replaced dressing with a pressure dressing. Will continue to monitor.

## 2020-01-23 NOTE — Plan of Care (Signed)
  Problem: Health Behavior/Discharge Planning: Goal: Ability to manage health-related needs will improve Outcome: Progressing   Problem: Clinical Measurements: Goal: Will remain free from infection Outcome: Progressing Goal: Diagnostic test results will improve Outcome: Progressing Goal: Respiratory complications will improve Outcome: Progressing Goal: Cardiovascular complication will be avoided Outcome: Progressing   Problem: Activity: Goal: Risk for activity intolerance will decrease Outcome: Progressing   Problem: Nutrition: Goal: Adequate nutrition will be maintained Outcome: Progressing   Problem: Coping: Goal: Level of anxiety will decrease Outcome: Progressing   Problem: Elimination: Goal: Will not experience complications related to bowel motility Outcome: Progressing Goal: Will not experience complications related to urinary retention Outcome: Progressing   Problem: Pain Managment: Goal: General experience of comfort will improve Outcome: Progressing   Problem: Safety: Goal: Ability to remain free from injury will improve Outcome: Progressing   Problem: Skin Integrity: Goal: Risk for impaired skin integrity will decrease Outcome: Progressing   

## 2020-01-23 NOTE — Progress Notes (Signed)
Pt using BR and had severe back spasm that buckled patients knees. Pt in severe pain. Assisted back to bed. Connected nephrostomy tube up to gravity drainage to see if that would assist with pain. Nephrostomy tube with 107ml output. Nephrostomy tube plugged back.

## 2020-01-23 NOTE — Anesthesia Postprocedure Evaluation (Signed)
Anesthesia Post Note  Patient: Daniel Deleon  Procedure(s) Performed: NEPHROLITHOTOMY PERCUTANEOUS (Left ) HOLMIUM LASER APPLICATION (Left )     Patient location during evaluation: PACU Anesthesia Type: General Level of consciousness: sedated Pain management: pain level controlled Vital Signs Assessment: post-procedure vital signs reviewed and stable Respiratory status: spontaneous breathing and respiratory function stable Cardiovascular status: stable Postop Assessment: no apparent nausea or vomiting Anesthetic complications: no                  Mychelle Kendra DANIEL

## 2020-01-23 NOTE — Discharge Summary (Addendum)
Date of admission: 01/22/2020  Date of discharge: 01/23/2020  Admission diagnosis: left renal calculus  Discharge diagnosis: left renal caclulus  Secondary diagnoses:  Patient Active Problem List   Diagnosis Date Noted  . Renal calculus 01/22/2020  . Hypersomnia with sleep apnea, unspecified 05/13/2014  . Snoring 05/13/2014  . Vivid dream 05/13/2014  . Obesity, unspecified 05/13/2014  . PNEUMONIA, HX OF 07/10/2007    Procedures performed: Procedure(s): NEPHROLITHOTOMY PERCUTANEOUS HOLMIUM LASER APPLICATION  History and Physical: For full details, please see admission history and physical. Briefly, Daniel Deleon is a 54 y.o. year old patient with partial left staghorn calculus.   Hospital Course: Patient tolerated the procedure well.  He was then transferred to the floor after an uneventful PACU stay.  His hospital course was uncomplicated.  Foley was removed on AM of POD1 and PCN tube was capped.  He tolerated the capping and the PCN tube was removed (council tip foley).  He has a remaining JJ ureteral stent that will stay in place until follow up.  On POD#1 he had met discharge criteria: was eating a regular diet, was up and ambulating independently,  pain was well controlled, was voiding without a catheter, and was ready to for discharge.  Update: Called by RN that packing leaking from PCN site.  Dressing changed x 2.  Patient vitals stable and not in pain.  Okay to discharge and follow up as outpt.   Laboratory values:  Recent Labs    01/22/20 0806 01/23/20 0511  WBC 4.5 13.9*  HGB 15.4 14.0  HCT 45.9 42.1   Recent Labs    01/23/20 0511  NA 141  K 4.2  CL 104  CO2 27  GLUCOSE 151*  BUN 14  CREATININE 1.11  CALCIUM 9.3   Recent Labs    01/22/20 0806  INR 0.8   No results for input(s): LABURIN in the last 72 hours. Results for orders placed or performed during the hospital encounter of 01/18/20  SARS CORONAVIRUS 2 (TAT 6-24 HRS) Nasopharyngeal  Nasopharyngeal Swab     Status: None   Collection Time: 01/18/20 11:07 AM   Specimen: Nasopharyngeal Swab  Result Value Ref Range Status   SARS Coronavirus 2 NEGATIVE NEGATIVE Final    Comment: (NOTE) SARS-CoV-2 target nucleic acids are NOT DETECTED. The SARS-CoV-2 RNA is generally detectable in upper and lower respiratory specimens during the acute phase of infection. Negative results do not preclude SARS-CoV-2 infection, do not rule out co-infections with other pathogens, and should not be used as the sole basis for treatment or other patient management decisions. Negative results must be combined with clinical observations, patient history, and epidemiological information. The expected result is Negative. Fact Sheet for Patients: SugarRoll.be Fact Sheet for Healthcare Providers: https://www.woods-mathews.com/ This test is not yet approved or cleared by the [place] FDA and  has been authorized for detection and/or diagnosis of SARS-CoV-2 by FDA under an Emergency Use Authorization (EUA). This EUA will remain  in effect (meaning this test can be used) for the duration of the COVID-19 declaration under Section 56 4(b)(1) of the Act, 21 U.S.C. section 360bbb-3(b)(1), unless the authorization is terminated or revoked sooner. Performed at [facility], [place] [address] [place], [place] [address]     Disposition: Home  Discharge instruction: The patient was instructed to be ambulatory but told to refrain from heavy lifting, strenuous activity, or driving.   Discharge medications:  Allergies as of 01/23/2020      Reactions  Other Swelling   Bee   Diphenhydramine Hcl    Hives    Mushroom Extract Complex Nausea Only   Blot   Peanut-containing Drug Products Nausea And Vomiting   Peanut butter      Medication List    STOP taking these medications   ondansetron 4 MG disintegrating tablet Commonly known as: Zofran ODT      TAKE these medications   acetaminophen 500 MG tablet Commonly known as: TYLENOL Take 500 mg by mouth every 6 (six) hours as needed for mild pain.   oxybutynin 5 MG tablet Commonly known as: DITROPAN Take 1 tablet (5 mg total) by mouth every 8 (eight) hours as needed for up to 30 doses for bladder spasms.   oxyCODONE-acetaminophen 5-325 MG tablet Commonly known as: Percocet Take 1 tablet by mouth every 6 (six) hours as needed for severe pain. What changed:   how much to take  when to take this  reasons to take this   senna-docusate 8.6-50 MG tablet Commonly known as: Senokot-S Take 2 tablets by mouth daily as needed for mild constipation.   tamsulosin 0.4 MG Caps capsule Commonly known as: FLOMAX Take 1 capsule (0.4 mg total) by mouth daily. What changed: when to take this   vitamin C 250 MG tablet Commonly known as: ASCORBIC ACID Take 250 mg by mouth 2 (two) times a week.       Followup:  Follow-up Information    [facility].   Contact information: [address] [phone]       Please follow up.   Why: 4:15pm  on 4/22 with Dr. Claudia Desanctis

## 2020-01-23 NOTE — Progress Notes (Signed)
D/C instructions and dressing change reviewed with patient and spouse. Pt spouse successfully performed dressing change. Dressing supplies sent with patient. Patient states not further questions at this time.

## 2020-02-15 ENCOUNTER — Other Ambulatory Visit: Payer: Self-pay | Admitting: Urology

## 2020-04-25 ENCOUNTER — Encounter (HOSPITAL_BASED_OUTPATIENT_CLINIC_OR_DEPARTMENT_OTHER): Payer: Self-pay | Admitting: Urology

## 2020-04-28 ENCOUNTER — Other Ambulatory Visit: Payer: Self-pay

## 2020-04-28 ENCOUNTER — Encounter (HOSPITAL_BASED_OUTPATIENT_CLINIC_OR_DEPARTMENT_OTHER): Payer: Self-pay | Admitting: Urology

## 2020-04-28 NOTE — Progress Notes (Signed)
Spoke w/ via phone for pre-op interview--- PT Lab needs dos----  no             Lab results------ no COVID test ------ 05-03-2020 @ 0915 Arrive at ------- 0845 NPO after MN NO Solid Food.  Clear liquids from MN until--- 0700 then nothing by mouth Medications to take morning of surgery ----- NONE Diabetic medication ----- n/a Patient Special Instructions ----- n/a Pre-Op special Istructions ----- n/a Patient verbalized understanding of instructions that were given at this phone interview. Patient denies shortness of breath, chest pain, fever, cough a this phone interview.

## 2020-05-03 ENCOUNTER — Other Ambulatory Visit (HOSPITAL_COMMUNITY): Payer: BC Managed Care – PPO

## 2020-05-06 ENCOUNTER — Ambulatory Visit (HOSPITAL_BASED_OUTPATIENT_CLINIC_OR_DEPARTMENT_OTHER): Admission: RE | Admit: 2020-05-06 | Payer: BC Managed Care – PPO | Source: Ambulatory Visit | Admitting: Urology

## 2020-05-06 HISTORY — DX: Other intervertebral disc degeneration, lumbosacral region: M51.37

## 2020-05-06 HISTORY — DX: Spinal stenosis, lumbar region without neurogenic claudication: M48.061

## 2020-05-06 HISTORY — DX: Nocturia: R35.1

## 2020-05-06 HISTORY — DX: Sciatica, left side: M54.32

## 2020-05-06 HISTORY — DX: Calculus of kidney: N20.0

## 2020-05-06 HISTORY — DX: Other intervertebral disc degeneration, lumbosacral region without mention of lumbar back pain or lower extremity pain: M51.379

## 2020-05-06 HISTORY — DX: Obstructive sleep apnea (adult) (pediatric): G47.33

## 2020-05-06 SURGERY — CYSTOSCOPY/URETEROSCOPY/HOLMIUM LASER/STENT PLACEMENT
Anesthesia: General | Laterality: Left

## 2020-06-30 ENCOUNTER — Other Ambulatory Visit: Payer: Self-pay | Admitting: Urology

## 2020-07-15 ENCOUNTER — Encounter (HOSPITAL_BASED_OUTPATIENT_CLINIC_OR_DEPARTMENT_OTHER): Payer: Self-pay | Admitting: Urology

## 2020-07-15 ENCOUNTER — Other Ambulatory Visit: Payer: Self-pay

## 2020-07-15 ENCOUNTER — Other Ambulatory Visit (HOSPITAL_COMMUNITY)
Admission: RE | Admit: 2020-07-15 | Discharge: 2020-07-15 | Disposition: A | Discharge status: Home / Self Care | Payer: BC Managed Care – PPO | Source: Ambulatory Visit | Attending: Urology | Admitting: Urology

## 2020-07-15 DIAGNOSIS — Z01812 Encounter for preprocedural laboratory examination: Secondary | ICD-10-CM | POA: Insufficient documentation

## 2020-07-15 DIAGNOSIS — Z20822 Contact with and (suspected) exposure to covid-19: Secondary | ICD-10-CM | POA: Insufficient documentation

## 2020-07-15 LAB — SARS CORONAVIRUS 2 (TAT 6-24 HRS): SARS Coronavirus 2: NEGATIVE

## 2020-07-17 NOTE — Anesthesia Preprocedure Evaluation (Addendum)
Anesthesia Evaluation  Patient identified by MRN, date of birth, ID band Patient awake    Reviewed: Allergy & Precautions, NPO status , Patient's Chart, lab work & pertinent test results  Airway Mallampati: II  TM Distance: >3 FB Neck ROM: Full    Dental no notable dental hx. (+) Chipped, Dental Advisory Given,    Pulmonary sleep apnea and Continuous Positive Airway Pressure Ventilation ,    Pulmonary exam normal breath sounds clear to auscultation       Cardiovascular Exercise Tolerance: Good negative cardio ROS Normal cardiovascular exam Rhythm:Regular Rate:Normal     Neuro/Psych  Neuromuscular disease negative psych ROS   GI/Hepatic negative GI ROS, Neg liver ROS,   Endo/Other    Renal/GU Renal diseaseNephrolitiasis     Musculoskeletal  (+) Arthritis ,   Abdominal (+) + obese,   Peds  Hematology negative hematology ROS (+)   Anesthesia Other Findings   Reproductive/Obstetrics                            Anesthesia Physical Anesthesia Plan  ASA: II  Anesthesia Plan: General   Post-op Pain Management:    Induction: Intravenous  PONV Risk Score and Plan: 3 and Treatment may vary due to age or medical condition, Ondansetron, Midazolam and Dexamethasone  Airway Management Planned: LMA  Additional Equipment:   Intra-op Plan:   Post-operative Plan:   Informed Consent: I have reviewed the patients History and Physical, chart, labs and discussed the procedure including the risks, benefits and alternatives for the proposed anesthesia with the patient or authorized representative who has indicated his/her understanding and acceptance.     Dental advisory given  Plan Discussed with: CRNA and Anesthesiologist  Anesthesia Plan Comments:        Anesthesia Quick Evaluation

## 2020-07-17 NOTE — Progress Notes (Signed)
Spoke w/ via phone for pre-op interview---pt Lab needs dos----    none           Lab results------none COVID test ------07-15-20 Arrive at -------1130 am 07-18-20 NPO after MN NO Solid Food.  Clear liquids from MN until---1030 am then npo Medications to take morning of surgery -----none Diabetic medication -----n/a Patient Special Instructions -----none Pre-Op special Istructions -----none Patient verbalized understanding of instructions that were given at this phone interview. Patient denies shortness of breath, chest pain, fever, cough at this phone interview.

## 2020-07-18 ENCOUNTER — Ambulatory Visit (HOSPITAL_BASED_OUTPATIENT_CLINIC_OR_DEPARTMENT_OTHER): Payer: BC Managed Care – PPO | Admitting: Anesthesiology

## 2020-07-18 ENCOUNTER — Ambulatory Visit (HOSPITAL_BASED_OUTPATIENT_CLINIC_OR_DEPARTMENT_OTHER)
Admission: RE | Admit: 2020-07-18 | Discharge: 2020-07-18 | Disposition: A | Discharge status: Home / Self Care | Payer: BC Managed Care – PPO | Attending: Urology | Admitting: Urology

## 2020-07-18 ENCOUNTER — Encounter (HOSPITAL_BASED_OUTPATIENT_CLINIC_OR_DEPARTMENT_OTHER): Payer: Self-pay | Admitting: Urology

## 2020-07-18 ENCOUNTER — Other Ambulatory Visit: Payer: Self-pay

## 2020-07-18 ENCOUNTER — Encounter (HOSPITAL_BASED_OUTPATIENT_CLINIC_OR_DEPARTMENT_OTHER)
Admission: RE | Disposition: A | Discharge status: Home / Self Care | Payer: Self-pay | Source: Home / Self Care | Attending: Urology

## 2020-07-18 DIAGNOSIS — M1990 Unspecified osteoarthritis, unspecified site: Secondary | ICD-10-CM | POA: Diagnosis not present

## 2020-07-18 DIAGNOSIS — G4730 Sleep apnea, unspecified: Secondary | ICD-10-CM | POA: Insufficient documentation

## 2020-07-18 DIAGNOSIS — N200 Calculus of kidney: Secondary | ICD-10-CM | POA: Diagnosis not present

## 2020-07-18 HISTORY — PX: HOLMIUM LASER APPLICATION: SHX5852

## 2020-07-18 HISTORY — PX: CYSTOSCOPY WITH RETROGRADE PYELOGRAM, URETEROSCOPY AND STENT PLACEMENT: SHX5789

## 2020-07-18 SURGERY — CYSTOURETEROSCOPY, WITH RETROGRADE PYELOGRAM AND STENT INSERTION
Anesthesia: General | Site: Ureter | Laterality: Left

## 2020-07-18 MED ORDER — HYDROMORPHONE HCL 1 MG/ML IJ SOLN: 0.2500 mg | INTRAMUSCULAR | Status: DC | PRN

## 2020-07-18 MED ORDER — ONDANSETRON HCL 4 MG/2ML IJ SOLN
INTRAMUSCULAR | Status: AC
  Filled 2020-07-18: qty 2

## 2020-07-18 MED ORDER — SODIUM CHLORIDE 0.9 % IV SOLN: INTRAVENOUS | Status: DC

## 2020-07-18 MED ORDER — OXYBUTYNIN CHLORIDE 5 MG PO TABS: 5.0000 mg | ORAL_TABLET | Freq: Three times a day (TID) | ORAL | 0 refills | Status: AC | PRN

## 2020-07-18 MED ORDER — LIDOCAINE 2% (20 MG/ML) 5 ML SYRINGE
INTRAMUSCULAR | Status: DC | PRN
  Administered 2020-07-18: 100 mg via INTRAVENOUS

## 2020-07-18 MED ORDER — OXYBUTYNIN CHLORIDE 5 MG PO TABS
ORAL_TABLET | ORAL | Status: AC
  Filled 2020-07-18: qty 1

## 2020-07-18 MED ORDER — PHENAZOPYRIDINE HCL 100 MG PO TABS: 100.0000 mg | ORAL_TABLET | Freq: Three times a day (TID) | ORAL | 0 refills | Status: AC | PRN

## 2020-07-18 MED ORDER — CIPROFLOXACIN HCL 500 MG PO TABS: 500.0000 mg | ORAL_TABLET | Freq: Once | ORAL | 0 refills | Status: AC

## 2020-07-18 MED ORDER — AMISULPRIDE (ANTIEMETIC) 5 MG/2ML IV SOLN: 10.0000 mg | Freq: Once | INTRAVENOUS | Status: DC | PRN

## 2020-07-18 MED ORDER — HYDROCODONE-ACETAMINOPHEN 7.5-325 MG PO TABS
1.0000 | ORAL_TABLET | Freq: Once | ORAL | Status: AC | PRN
  Administered 2020-07-18: 1 via ORAL

## 2020-07-18 MED ORDER — OXYCODONE-ACETAMINOPHEN 5-325 MG PO TABS
1.0000 | ORAL_TABLET | Freq: Four times a day (QID) | ORAL | 0 refills | Status: DC | PRN
Start: 2020-07-18 — End: 2021-07-27

## 2020-07-18 MED ORDER — DEXAMETHASONE SODIUM PHOSPHATE 10 MG/ML IJ SOLN
INTRAMUSCULAR | Status: DC | PRN
  Administered 2020-07-18: 10 mg via INTRAVENOUS

## 2020-07-18 MED ORDER — PROMETHAZINE HCL 25 MG/ML IJ SOLN: 12.5000 mg | Freq: Once | INTRAMUSCULAR | Status: DC | PRN

## 2020-07-18 MED ORDER — FENTANYL CITRATE (PF) 100 MCG/2ML IJ SOLN
INTRAMUSCULAR | Status: AC
  Filled 2020-07-18: qty 2

## 2020-07-18 MED ORDER — ACETAMINOPHEN 10 MG/ML IV SOLN: 1000.0000 mg | Freq: Once | INTRAVENOUS | Status: DC | PRN

## 2020-07-18 MED ORDER — LIDOCAINE 2% (20 MG/ML) 5 ML SYRINGE
INTRAMUSCULAR | Status: AC
  Filled 2020-07-18: qty 5

## 2020-07-18 MED ORDER — SODIUM CHLORIDE 0.9 % IV SOLN
INTRAVENOUS | Status: AC
  Filled 2020-07-18: qty 100

## 2020-07-18 MED ORDER — ONDANSETRON HCL 4 MG/2ML IJ SOLN
INTRAMUSCULAR | Status: DC | PRN
  Administered 2020-07-18: 4 mg via INTRAVENOUS

## 2020-07-18 MED ORDER — HYDROCODONE-ACETAMINOPHEN 7.5-325 MG PO TABS
ORAL_TABLET | ORAL | Status: AC
Start: 2020-07-18 — End: ?
  Filled 2020-07-18: qty 1

## 2020-07-18 MED ORDER — FENTANYL CITRATE (PF) 100 MCG/2ML IJ SOLN
INTRAMUSCULAR | Status: DC | PRN
Start: 2020-07-18 — End: 2020-07-18
  Administered 2020-07-18 (×3): 25 ug via INTRAVENOUS
  Administered 2020-07-18: 100 ug via INTRAVENOUS
  Administered 2020-07-18: 25 ug via INTRAVENOUS

## 2020-07-18 MED ORDER — DEXAMETHASONE SODIUM PHOSPHATE 10 MG/ML IJ SOLN
INTRAMUSCULAR | Status: AC
  Filled 2020-07-18: qty 1

## 2020-07-18 MED ORDER — PROPOFOL 10 MG/ML IV BOLUS
INTRAVENOUS | Status: DC | PRN
  Administered 2020-07-18: 200 mg via INTRAVENOUS

## 2020-07-18 MED ORDER — MIDAZOLAM HCL 2 MG/2ML IJ SOLN
INTRAMUSCULAR | Status: AC
  Filled 2020-07-18: qty 2

## 2020-07-18 MED ORDER — SODIUM CHLORIDE 0.9 % IR SOLN
Status: DC | PRN
  Administered 2020-07-18: 3000 mL via INTRAVESICAL

## 2020-07-18 MED ORDER — MIDAZOLAM HCL 5 MG/5ML IJ SOLN
INTRAMUSCULAR | Status: DC | PRN
  Administered 2020-07-18: 2 mg via INTRAVENOUS

## 2020-07-18 MED ORDER — CEFTRIAXONE SODIUM 2 G IJ SOLR
INTRAMUSCULAR | Status: AC
  Filled 2020-07-18: qty 20

## 2020-07-18 MED ORDER — SODIUM CHLORIDE 0.9 % IV SOLN
2.0000 g | INTRAVENOUS | Status: AC
  Administered 2020-07-18: 2 g via INTRAVENOUS

## 2020-07-18 MED ORDER — PROPOFOL 10 MG/ML IV BOLUS
INTRAVENOUS | Status: AC
  Filled 2020-07-18: qty 20

## 2020-07-18 MED ORDER — IOHEXOL 300 MG/ML  SOLN
INTRAMUSCULAR | Status: DC | PRN
  Administered 2020-07-18: .1 mL

## 2020-07-18 MED ORDER — OXYBUTYNIN CHLORIDE 5 MG PO TABS
5.0000 mg | ORAL_TABLET | Freq: Three times a day (TID) | ORAL | Status: DC
  Administered 2020-07-18: 5 mg via ORAL

## 2020-07-18 SURGICAL SUPPLY — 25 items
BAG DRAIN URO-CYSTO SKYTR STRL (DRAIN) ×3 IMPLANT
BAG DRN UROCATH (DRAIN) ×1
BASKET LASER NITINOL 1.9FR (BASKET) ×3 IMPLANT
BASKET ZERO TIP NITINOL 2.4FR (BASKET) ×6 IMPLANT
BSKT STON RTRVL 120 1.9FR (BASKET) ×1
BSKT STON RTRVL ZERO TP 2.4FR (BASKET) ×2
CATH URET 5FR 28IN OPEN ENDED (CATHETERS) ×3 IMPLANT
CLOTH BEACON ORANGE TIMEOUT ST (SAFETY) ×3 IMPLANT
DRSG TEGADERM 2-3/8X2-3/4 SM (GAUZE/BANDAGES/DRESSINGS) ×3 IMPLANT
DRSG TEGADERM 4X4.75 (GAUZE/BANDAGES/DRESSINGS) IMPLANT
EXTRACTOR STONE 1.7FRX115CM (UROLOGICAL SUPPLIES) IMPLANT
FIBER LASER TRAC TIP (UROLOGICAL SUPPLIES) ×3 IMPLANT
GLOVE BIO SURGEON STRL SZ 6.5 (GLOVE) ×2 IMPLANT
GLOVE BIO SURGEONS STRL SZ 6.5 (GLOVE) ×1
GOWN STRL REUS W/TWL LRG LVL3 (GOWN DISPOSABLE) ×3 IMPLANT
GUIDEWIRE STR DUAL SENSOR (WIRE) ×6 IMPLANT
IV NS IRRIG 3000ML ARTHROMATIC (IV SOLUTION) ×6 IMPLANT
KIT TURNOVER CYSTO (KITS) ×3 IMPLANT
MANIFOLD NEPTUNE II (INSTRUMENTS) ×3 IMPLANT
PACK CYSTO (CUSTOM PROCEDURE TRAY) ×3 IMPLANT
SHEATH URET ACCESS 12FR/35CM (UROLOGICAL SUPPLIES) ×3 IMPLANT
STENT URET 6FRX26 CONTOUR (STENTS) ×3 IMPLANT
TUBE CONNECTING 12'X1/4 (SUCTIONS) ×1
TUBE CONNECTING 12X1/4 (SUCTIONS) ×2 IMPLANT
TUBING UROLOGY SET (TUBING) ×3 IMPLANT

## 2020-07-18 NOTE — Transfer of Care (Signed)
Immediate Anesthesia Transfer of Care Note  Patient: Daniel Deleon  Procedure(s) Performed: CYSTOSCOPY WITH RETROGRADE PYELOGRAM, URETEROSCOPY AND STENT EXCHANGE (Left Ureter) HOLMIUM LASER APPLICATION (Left Ureter)  Patient Location: PACU  Anesthesia Type:General  Level of Consciousness: oriented, drowsy and patient cooperative  Airway & Oxygen Therapy: Patient Spontanous Breathing and Patient connected to nasal cannula oxygen  Post-op Assessment: Report given to RN and Post -op Vital signs reviewed and stable  Post vital signs: Reviewed and stable  Last Vitals:  Vitals Value Taken Time  BP 148/93 07/18/20 1505  Temp    Pulse 90 07/18/20 1507  Resp 13 07/18/20 1507  SpO2 94 % 07/18/20 1507  Vitals shown include unvalidated device data.  Last Pain:  Vitals:   07/18/20 1154  TempSrc: Oral         Complications: No complications documented.

## 2020-07-18 NOTE — H&P (Signed)
CC/HPI: cc: postop   02/07/20: 54 year old man with left staghorn calculus status post left PCNL 01/29/2020 who returns for follow-up. Patient left the hospital with internal double-J stent. He is doing well since surgery but has some mild swelling on the left side and mild irritative voiding symptoms. He is otherwise back to work and doing fine. Stone analysis shows 90% calcium oxalate monohydrate 10% uric acid. Lower pole stone was an accessible with both flexible ureteral scope and cystoscope during the PCNL.   04/24/2020: Patient here today for preoperative appointment prior to undergoing ureteroscopy on the left side to treat residual stone burden from PCNL earlier this year. Patient continues to have an indwelling stent and is tolerating it okay with only intermittent irritative voiding symptoms in the form of urinary urgency and occasional burning with urination. He has not had much in the way of any type of unilateral/left-sided pain or discomfort requiring the use of pain medication. He has not had any significant or persistent gross hematuria. Denies any changes to past medical history, prescription medications, or past surgical history since time of last office visit. No interval fevers or chills, nausea/vomiting. He denies any interval stone material passage, interval treatment for UTI or other infectious process.   06/17/20: Deavion is a 54 year old male with a past medical history of nephrolithiasis. He underwent left PCNL on 01/29/2020. At that time a left-sided double-J stent was placed. He was supposed to presents for ureteroscopy on 04/30/2020 however, due to family emergencies he has been unable to follow up and have the surgery completed. His left stent has been in place since January 29, 2020. He presents today with left-sided flank discomfort, gross hematuria, dysuria that began 1 week ago. He reports that the more water he drinks the left blood he sees however his urine is pink. He denies  fevers, chills, nausea, vomiting. 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     ALLERGIES: Benadryl CAPS    MEDICATIONS: Vitamin C 500 mg tablet Oral     GU PSH: None     PSH Notes: Eye Surgery   NON-GU PSH: None   GU PMH: Renal calculus (Stable), Left, I reviewed the CT scan with the patient and showed him is images. Patient has a left partial staghorn calculus that appears to become fries of 2 stones approximately 2 cm each. We discussed different management options including observation, ESWL, stage ureteroscopy and PCNL. Due to the size of the stone I have recommended a left PCNL with possible staged ureteroscopy after. We discussed the risks and benefits of a PCNL and patient would like to proceed sometime in the spring. As he is not symptomatic at the moment, no acute intervention. If obstructions develops I would favor PCN tube over stent so that it could be used for access. - 10/02/2019, Nephrolithiasis, - 2017 Abdominal Pain Unspec, Left flank pain - 2017 Gross hematuria, Gross hematuria - 2017 Microscopic hematuria, Benign microscopic hematuria - 2017 Encounter for Prostate Cancer screening, Prostate cancer screening - 2014 History of urolithiasis, Nephrolithiasis - 2014    NON-GU PMH: Encounter for general adult medical examination without abnormal findings, Encounter for preventive health examination - 2017 Obstructive sleep apnea (adult) (pediatric), Obstructive sleep apnea, adult - 2017 Personal history of other diseases of the digestive system, History of gastric ulcer - 2017    FAMILY HISTORY: Acute Myocardial  Infarction - Runs In Family Bladder Cancer - Runs In Family Hematuria - Runs In Family Hypertension - Runs In Family Kidney Stones - Runs In Family   SOCIAL HISTORY: Marital Status: Married Preferred Language: English; Ethnicity: Not Hispanic Or  Latino; Race: Black or African American Current Smoking Status: Patient has never smoked.   Tobacco Use Assessment Completed: Used Tobacco in last 30 days? Has never drank.  Drinks 1 caffeinated drink per day.     Notes: Occupation, Married, No alcohol use, No caffeine use, Number of children, Never a smoker   REVIEW OF SYSTEMS:    GU Review Male:   Patient reports frequent urination, hard to postpone urination, burning/ pain with urination, and get up at night to urinate. Patient denies leakage of urine, stream starts and stops, trouble starting your stream, have to strain to urinate , erection problems, and penile pain.  Gastrointestinal (Upper):   Patient denies nausea, vomiting, and indigestion/ heartburn.  Gastrointestinal (Lower):   Patient denies diarrhea and constipation.  Constitutional:   Patient denies fever, night sweats, weight loss, and fatigue.  Skin:   Patient denies skin rash/ lesion and itching.  Eyes:   Patient denies blurred vision and double vision.  Ears/ Nose/ Throat:   Patient denies sore throat and sinus problems.  Hematologic/Lymphatic:   Patient denies swollen glands and easy bruising.  Cardiovascular:   Patient denies leg swelling and chest pains.  Respiratory:   Patient denies cough and shortness of breath.  Endocrine:   Patient denies excessive thirst.  Musculoskeletal:   Patient denies back pain and joint pain.  Neurological:   Patient denies headaches and dizziness.  Psychologic:   Patient denies depression and anxiety.   VITAL SIGNS:      06/17/2020 02:12 PM  Weight 226 lb / 102.51 kg  Height 70 in / 177.8 cm  BP 175/89 mmHg  Pulse 75 /min  Temperature 96.9 F / 36.0 C  BMI 32.4 kg/m   GU PHYSICAL EXAMINATION:      Notes: no CVA tenderness.    MULTI-SYSTEM PHYSICAL EXAMINATION:    Constitutional: Well-nourished. No physical deformities. Normally developed. Good grooming.  Respiratory: No labored breathing, no use of accessory muscles.    Cardiovascular: Normal temperature, normal extremity pulses, no swelling, no varicosities.  Skin: No paleness, no jaundice, no cyanosis. No lesion, no ulcer, no rash.  Neurologic / Psychiatric: Oriented to time, oriented to place, oriented to person. No depression, no anxiety, no agitation.  Gastrointestinal: No mass, no tenderness, no rigidity, non obese abdomen.  Musculoskeletal: Normal gait and station of head and neck.     Complexity of Data:  Source Of History:  Patient, Family/Caregiver, Medical Record Summary  Records Review:   Previous Doctor Records, Previous Hospital Records, Previous Patient Records  Urine Test Review:   Urinalysis, Urine Culture  X-Ray Review: C.T. Abdomen/Pelvis: Reviewed Films.     10/10/08  PSA  Total PSA 0.55     06/17/20  Urinalysis  Urine Appearance Cloudy   Urine Color Yellow   Urine Glucose 1+ mg/dL  Urine Bilirubin Neg mg/dL  Urine Ketones Neg mg/dL  Urine Specific Gravity 1.025   Urine Blood 3+ ery/uL  Urine pH 6.0   Urine Protein 2+ mg/dL  Urine Urobilinogen 0.2 mg/dL  Urine Nitrites Neg   Urine Leukocyte Esterase 2+ leu/uL  Urine WBC/hpf 6 - 10/hpf   Urine RBC/hpf >60/hpf   Urine Epithelial Cells 0 - 5/hpf   Urine Bacteria Mod (26-50/hpf)  Urine Mucous Present   Urine Yeast NS (Not Seen)   Urine Trichomonas Not Present   Urine Cystals NS (Not Seen)   Urine Casts NS (Not Seen)   Urine Sperm Not Present    PROCEDURES:         KUB - 21308  A single view of the abdomen is obtained. Bilateral renal shadows are well visualized.  Ureteral Stent:  In position ureteral stent. Left ureteral stent.      Patient confirmed No Neulasta OnPro Device.           Urinalysis w/Scope Dipstick Dipstick Cont'd Micro  Color: Yellow Bilirubin: Neg mg/dL WBC/hpf: 6 - 65/HQI  Appearance: Cloudy Ketones: Neg mg/dL RBC/hpf: >69/GEX  Specific Gravity: 1.025 Blood: 3+ ery/uL Bacteria: Mod (26-50/hpf)  pH: 6.0 Protein: 2+ mg/dL Cystals: NS (Not  Seen)  Glucose: 1+ mg/dL Urobilinogen: 0.2 mg/dL Casts: NS (Not Seen)    Nitrites: Neg Trichomonas: Not Present    Leukocyte Esterase: 2+ leu/uL Mucous: Present      Epithelial Cells: 0 - 5/hpf      Yeast: NS (Not Seen)      Sperm: Not Present         Ceftriaxone 1g - 52841, L2440 The site was sterilely prepped with alcohol. ceftriaxone1gm was injected IM using standard technique. The patient tolerated the procedure well. A band aid was applied. The site was dry when the patient left the exam room.     Qty: 1 Adm. By: Golden Pop  Unit: gram Lot No [id number]  Route: IM Exp. Date 04/10/2021  Freq: None Mfgr.:   Site: Left Buttock   ASSESSMENT:      ICD-10 Details  1 GU:   Renal calculus - N20.0 Chronic, Stable  2   Acute Cystitis/UTI - N30.00 Undiagnosed New Problem   PLAN:            Medications New Meds: Cephalexin 500 mg capsule 1 capsule PO BID   #14  0 Refill(s)            Orders Labs CULTURE, URINE  X-Rays: KUB          Schedule Procedure: 06/17/2020 at [facility] - [address] - Ceftriaxone 1g (Injection, Ceftriaxone Sodium, Per 250 Mg) - N0272, 53664          Document Letter(s):  Created for Patient: Clinical Summary         Notes:   Urinalysis is concerning today with moderate bacteriuria and pyuria. I will send for a culture and treat empirically with 1 g of IM Rocephin now. Will adjust plan based on culture results. KUB ordered today. Notify patient of KUB findings. Advised the patient that distant has been in longer than intended and can cause severe consequences if not exchanged within a reasonable amount of time. Discussed the risks of leaving the stent in position too long. He verbalized understanding. a surgical sheet was handed to the scheduler. KUB is not concerning for malposition of STENT or obvious encrustations. Strict return precautions discussed for worsening symptoms.

## 2020-07-18 NOTE — Interval H&P Note (Signed)
History and Physical Interval Note:  07/18/2020 12:33 PM  Daniel Deleon  has presented today for surgery, with the diagnosis of LEFT INDWELLING STENT, LEFT URETERAL CALCULUS.  The various methods of treatment have been discussed with the patient and family. After consideration of risks, benefits and other options for treatment, the patient has consented to  Procedure(s) with comments: CYSTOSCOPY WITH RETROGRADE PYELOGRAM, URETEROSCOPY AND STENT EXCHANGE (Left) - 1 HR HOLMIUM LASER APPLICATION (Left) as a surgical intervention.  The patient's history has been reviewed, patient examined, no change in status, stable for surgery.  I have reviewed the patient's chart and labs.  Questions were answered to the patient's satisfaction.     Salman Wellen D Dovid Bartko

## 2020-07-18 NOTE — Discharge Instructions (Signed)
DISCHARGE INSTRUCTIONS FOR KIDNEY STONE/URETERAL STENT   MEDICATIONS:  1. Resume all your other meds from home - except do not take any extra narcotic pain meds that you may have at home.  2. Pyridium is to help with the burning/stinging when you urinate. 3. Percocet is for moderate/severe pain, otherwise taking upto 1000 mg every 6 hours of plainTylenol will help treat your pain.   4. Take Cipro one hour prior to removal of your stent.  5. Oxybutynin is to help with bladder cramping/pressure  ACTIVITY:  1. No strenuous activity x 1week  2. No driving while on narcotic pain medications  3. Drink plenty of water  4. Continue to walk at home - you can still get blood clots when you are at home, so keep active, but don't over do it.  5. May return to work/school tomorrow or when you feel ready   BATHING:  1. You can shower and we recommend daily showers  2. You have a string coming from your urethra: The stent string is attached to your ureteral stent. Do not pull on this.   SIGNS/SYMPTOMS TO CALL:  Please call us if you have a fever greater than 101.5, uncontrolled nausea/vomiting, uncontrolled pain, dizziness, unable to urinate, bloody urine, chest pain, shortness of breath, leg swelling, leg pain, redness around wound, drainage from wound, or any other concerns or questions.   You can reach [place] at [phone].   FOLLOW-UP:  1. You have a string attached to your stent, you may remove it on Monday, October 11. To do this, pull the string until the stent iscompletely removed. You may feel an odd sensation in your back.    Post Anesthesia Home Care Instructions  Activity: Get plenty of rest for the remainder of the day. A responsible adult should stay with you for 24 hours following the procedure.  For the next 24 hours, DO NOT: -Drive a car -Advertising copywriter -Drink alcoholic beverages -Take any medication unless instructed by your physician -Make any legal decisions or sign  important papers.  Meals: Start with liquid foods such as gelatin or soup. Progress to regular foods as tolerated. Avoid greasy, spicy, heavy foods. If nausea and/or vomiting occur, drink only clear liquids until the nausea and/or vomiting subsides. Call your physician if vomiting continues.  Special Instructions/Symptoms: Your throat may feel dry or sore from the anesthesia or the breathing tube placed in your throat during surgery. If this causes discomfort, gargle with warm salt water. The discomfort should disappear within 24 hours.  If you had a scopolamine patch placed behind your ear for the management of post- operative nausea and/or vomiting:  1. The medication in the patch is effective for 72 hours, after which it should be removed.  Wrap patch in a tissue and discard in the trash. Wash hands thoroughly with soap and water. 2. You may remove the patch earlier than 72 hours if you experience unpleasant side effects which may include dry mouth, dizziness or visual disturbances. 3. Avoid touching the patch. Wash your hands with soap and water after contact with the patch.

## 2020-07-18 NOTE — Op Note (Signed)
Preoperative diagnosis: right renal calculus  Postoperative diagnosis: left renal calculus  Procedure:  1. Cystoscopy 2. left ureteroscopy, laser lithotripsy and stone removal 3. left 15F x 26 ureteral stent placement with string   Surgeon: Kasandra Knudsen, MD  Anesthesia: General  Complications: None  Intraoperative findings: 1. Normal anterior urethra 2. Mild lateral lobe prostatic enlargement 3. Large lower pole renal calculus dusted/fragmented 4. 15Fr x 26cm left JJ stent with string 5. Normal bladder mucosa  EBL: Minimal  Specimens: 1. left renalcalculus  Disposition of specimens: [facility] for stone analysis  Indication: Daniel Deleon is a 54 y.o.   patient with a 80mm left ureteral stone and associated left symptoms. After reviewing the management options for treatment, the patient elected to proceed with the above surgical procedure(s). We have discussed the potential benefits and risks of the procedure, side effects of the proposed treatment, the likelihood of the patient achieving the goals of the procedure, and any potential problems that might occur during the procedure or recuperation. Informed consent has been obtained.   Description of procedure:  The patient was taken to the operating room and general anesthesia was induced.  The patient was placed in the dorsal lithotomy position, prepped and draped in the usual sterile fashion, and preoperative antibiotics were administered. A preoperative time-out was performed.   Cystourethroscopy was performed.  The patient's urethra was examined and was normal demonstrated bilobar prostatic hypertrophy.  The bladder was then systematically examined in its entirety. There was no evidence for any bladder tumors, stones, or other mucosal pathology.    A 0.38 sensor guidewire was then advanced in the left ureteral orifice alongside the existing stent and up the left ureter into the renal pelvis under  fluoroscopic guidance.  Graspers were then used to remove the ureteral stent.  A second sensor wire was placed alongside the first sensor wire and advanced to the kidney with fluoroscopic guidance.  Ureteral access sheath was then placed over the second wire again with fluoroscopic guidance.  The inner sheath and wire removed.  Flexible ureteroscopy then took place and the large renal calculus was seen in the lower pole.  The escape basket was then used to reposition the stones in the lower pole to the upper pole for easier laser lithotripsy.  The stone was then dusted and fragmented with the 242 m holmium laser fiber.   All stones were then removed from the ureter with an escape basket.  Reinspection of the ureter revealed no remaining visible stones or fragments.   The wire was then backloaded through the cystoscope and a ureteral stent was advance over the wire using Seldinger technique.  The stent was positioned appropriately under fluoroscopic and cystoscopic guidance.  The wire was then removed with an adequate stent curl noted in the renal pelvis as well as in the bladder.  The bladder was then emptied and the procedure ended.  The patient appeared to tolerate the procedure well and without complications.  The patient was able to be awakened and transferred to the recovery unit in satisfactory condition.   Disposition: The tether of the stent was left on and secured to the ventral aspect of the patient's penis.  Instructions for removing the stent have been provided to the patient.

## 2020-07-18 NOTE — Anesthesia Procedure Notes (Signed)
Procedure Name: LMA Insertion Date/Time: 07/18/2020 1:16 PM Performed by: Tyrece Vanterpool D, CRNA Pre-anesthesia Checklist: Patient identified, Emergency Drugs available, Suction available and Patient being monitored Patient Re-evaluated:Patient Re-evaluated prior to induction Oxygen Delivery Method: Circle system utilized Preoxygenation: Pre-oxygenation with 100% oxygen Induction Type: IV induction Ventilation: Mask ventilation without difficulty LMA: LMA inserted LMA Size: 4.0 Tube type: Oral Number of attempts: 1 Placement Confirmation: positive ETCO2 and breath sounds checked- equal and bilateral Tube secured with: Tape Dental Injury: Teeth and Oropharynx as per pre-operative assessment

## 2020-07-21 ENCOUNTER — Encounter (HOSPITAL_BASED_OUTPATIENT_CLINIC_OR_DEPARTMENT_OTHER): Payer: Self-pay | Admitting: Urology

## 2020-07-21 NOTE — Anesthesia Postprocedure Evaluation (Signed)
Anesthesia Post Note  Patient: Daniel Deleon  Procedure(s) Performed: CYSTOSCOPY WITH RETROGRADE PYELOGRAM, URETEROSCOPY AND STENT EXCHANGE (Left Ureter) HOLMIUM LASER APPLICATION (Left Ureter)     Patient location during evaluation: PACU Anesthesia Type: General Level of consciousness: awake and alert Pain management: pain level controlled Vital Signs Assessment: post-procedure vital signs reviewed and stable Respiratory status: spontaneous breathing, nonlabored ventilation, respiratory function stable and patient connected to nasal cannula oxygen Cardiovascular status: blood pressure returned to baseline and stable Postop Assessment: no apparent nausea or vomiting Anesthetic complications: no   No complications documented.  Last Vitals:  Vitals:   07/18/20 1545 07/18/20 1700  BP: (!) 116/103 123/72  Pulse: 89 86  Resp: 13 16  Temp:  36.6 C  SpO2: 92% 93%    Last Pain:  Vitals:   07/21/20 1018  TempSrc:   PainSc: 4    Pain Goal:                   Trevor Iha

## 2020-12-01 IMAGING — US IR URETURAL STENT LEFT NEW ACCESS W/O SEP NEPHROSTOMY CATH
1 series · 1 of 1 positions shown · non-contrast
Comparison: None.

INDICATION: 53-year-old male with a history of left-sided staghorn calculus
referred for access pending antegrade nephrolithotomy

EXAM:
IR URETURAL STENT LEFT NEW ACCESS W/O SEP NEPHROSTOMY CATH

[Series 1: (id) · 1 of 1 slices shown]
[im 1/1]
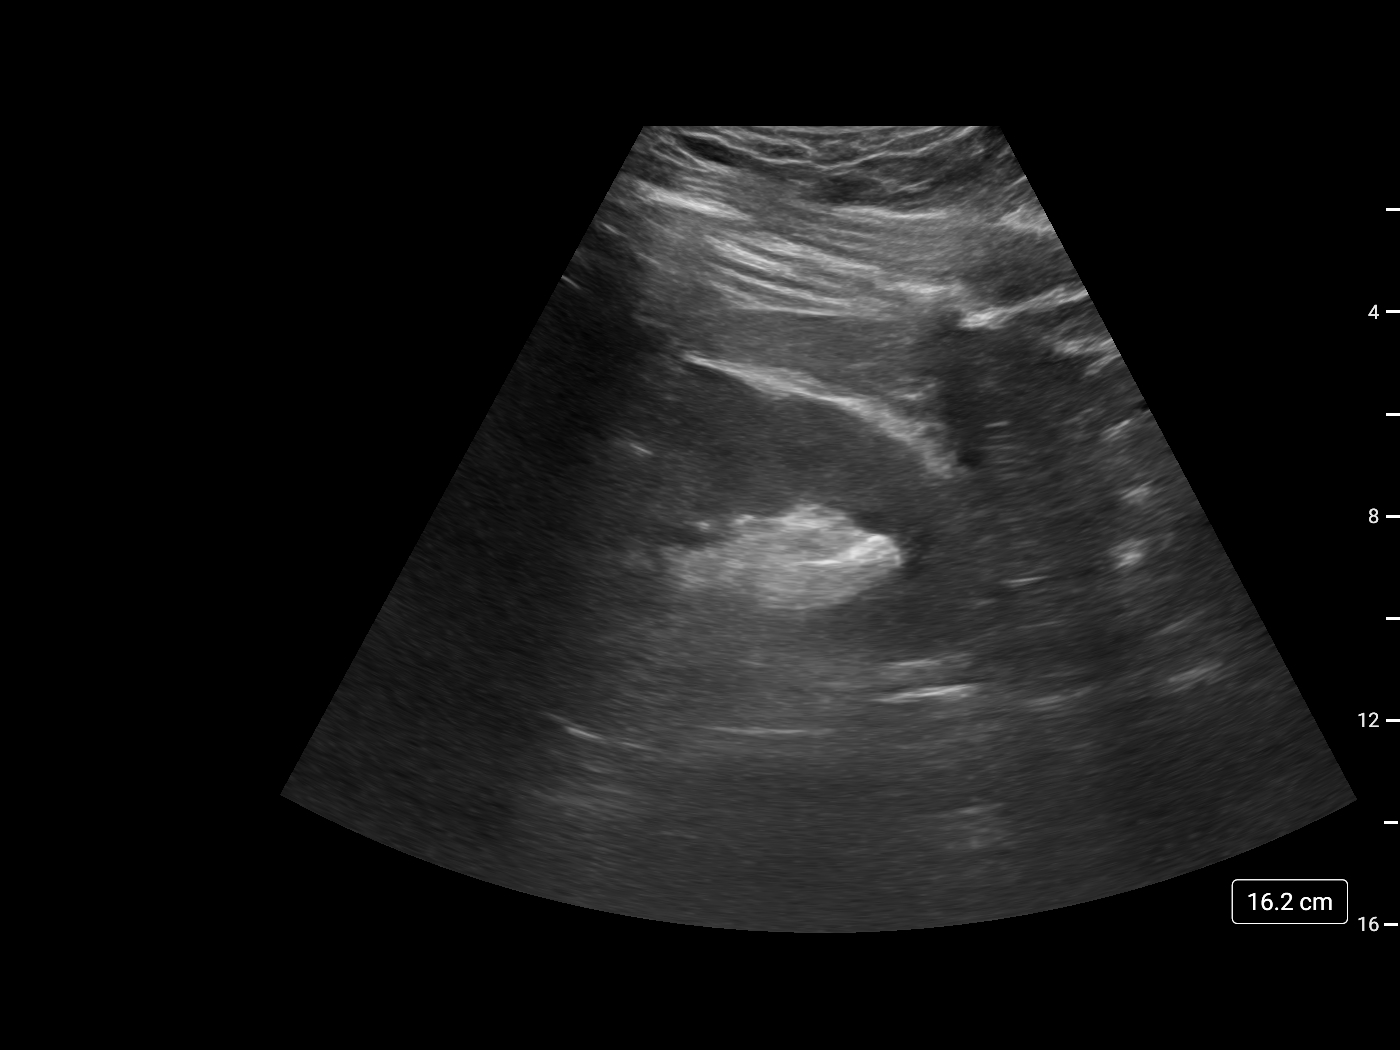

[1 of 1 positions shown; findings below may reference images not displayed]

MEDICATIONS:
2 g Ancef

ANESTHESIA/SEDATION:
Fentanyl 125 mcg IV; Versed 4 mg IV

Moderate Sedation Time:  24

The patient was continuously monitored during the procedure by the
interventional radiology nurse under my direct supervision.

CONTRAST:  20mL OMNIPAQUE IOHEXOL 300 MG/ML SOLN - administered into
the collecting system(s)

FLUOROSCOPY TIME:  Fluoroscopy Time: 5 minutes 18 seconds (235 mGy).

COMPLICATIONS:
None

PROCEDURE:
Informed written consent was obtained from the patient after a
thorough discussion of the procedural risks, benefits and
alternatives. All questions were addressed. Maximal Sterile Barrier
Technique was utilized including caps, mask, sterile gowns, sterile
gloves, sterile drape, hand hygiene and skin antiseptic. A timeout
was performed prior to the initiation of the procedure.

The procedure, risks, benefits, and alternatives were explained to
the patient, including the possiblity of failure. Questions
regarding the procedure were encouraged and answered.

The patient was position prone on the fluoroscopy table. The flank
was then prepped with chlorhexidine in the usual sterile fashion,
and a sterile drape was applied covering the operative field. A
sterile gown and sterile gloves were used for the procedure. Local
anesthesia was provided with 1% Lidocaine.

Scout images of the left flank were performed.

Ultrasound was performed with images stored sent to PACs.

The stone within the collecting system was targeted with
fluoroscopy. 1% lidocaine was used for local anesthesia. 21 gauge
Chiba needle was advanced under fluoroscopy targeting the stone in
the collecting system. Once the needle was adjacent to the stone,
contrast was infused for opacification of the collecting system.

Multiple obliquity we achieved.

Gas was infused into the collecting system for identification of a
posterior calyx.

We elected a midpole, posterior calyx as the secondary target using
a double stick technique, given the appearance of the stone burden.

A second needle was used to access the targeted calyx. Once we
confirmed needle tip position under fluoroscopy, a wire was advanced
into the collecting system. Wire was advanced beyond the collecting
system into the ureter.

Accustick set was then advanced into the ureter and the inner
dilator was removed. Contrast injection confirmed location in the
ureter. Bentson wire was placed into the ureter. Accustick was
removed and a 5 French 65 cm Kumpe the catheter advanced into the
ureter.

The Kumpe the catheter was then navigated into the urinary bladder
using a standard Glidewire. Catheter was curled in the urinary
bladder.

Wire was removed.

Final images were performed.

Catheter was sutured in position.

The patient tolerated procedure well and remained hemodynamically
stable throughout.

No complications were encountered and no significant blood loss was
encountered.
IMPRESSION: Status post image guided placement of left-sided antegrade
nephroureteral access for pending antegrade nephrolithotomy.

PLAN:
The indwelling catheter is a 65 cm 5 French catheter which will
accept any 035 wire to the urinary bladder.

The catheter is transmitted through a mid pole posterior calyx. The
course is between the eleventh and twelfth ribs.

## 2021-07-17 ENCOUNTER — Telehealth: Payer: Self-pay | Admitting: Pulmonary Disease

## 2021-07-17 NOTE — Telephone Encounter (Signed)
Patient is scheduled 07/27/21 for a sleep consult with Dr. Wynona Neat.  LM for Patient to call back.  I requested if Patient is already on cpap to bring SD card/chip and DME Patient uses. Will try Patient again at a later time.

## 2021-07-22 NOTE — Telephone Encounter (Signed)
Called and spoke with Patient.  Patient stated he uses a cpap that is older.  Patient stated he has mask issues and is bringing his SD card to [facility] for Dr. Wynona Neat.

## 2021-07-27 ENCOUNTER — Ambulatory Visit (INDEPENDENT_AMBULATORY_CARE_PROVIDER_SITE_OTHER): Payer: BC Managed Care – PPO | Admitting: Pulmonary Disease

## 2021-07-27 ENCOUNTER — Other Ambulatory Visit: Payer: Self-pay

## 2021-07-27 ENCOUNTER — Encounter: Payer: Self-pay | Admitting: Pulmonary Disease

## 2021-07-27 VITALS — BP 120/78 | HR 63 | Temp 98.3°F | Ht 70.0 in | Wt 237.0 lb

## 2021-07-27 DIAGNOSIS — G4733 Obstructive sleep apnea (adult) (pediatric): Secondary | ICD-10-CM | POA: Diagnosis not present

## 2021-07-27 NOTE — Progress Notes (Signed)
Daniel Deleon    [id number]    1966-07-26  Primary Care Physician:Shelton, Cala Bradford, MD  Referring Physician: Andi Devon, MD [address] [place],  [place] [address]  Chief complaint:   Patient being seen for sleep apnea  HPI:  Diagnosed with sleep apnea in 2015 Has been using CPAP up until about 8 months ago -Machine has become less effective, less well-tolerated  History of snoring, witnessed apneas  Usually goes to bed between 10 and 12 Falls asleep easily Up to 4 awakenings Final wake up time about 6:20 AM  Weight is down overall about 20 pounds  Admits to dryness of his mouth No choking episodes Primary care No headaches  Dad snored-that was able to get off CPAP with low weight loss  Non-smoker  Outpatient Encounter Medications as of 07/27/2021  Medication Sig   acetaminophen (TYLENOL) 500 MG tablet Take 500 mg by mouth every 6 (six) hours as needed for mild pain.   oxybutynin (DITROPAN) 5 MG tablet Take 1 tablet (5 mg total) by mouth every 8 (eight) hours as needed for bladder spasms.   Semaglutide,0.25 or 0.5MG /DOS, (OZEMPIC, 0.25 OR 0.5 MG/DOSE,) 2 MG/1.5ML SOPN Inject 0.25 mg into the skin once a week.   vitamin C (ASCORBIC ACID) 250 MG tablet Take 250 mg by mouth 2 (two) times a week.   [DISCONTINUED] oxyCODONE-acetaminophen (PERCOCET/ROXICET) 5-325 MG tablet Take 1 tablet by mouth every 6 (six) hours as needed for severe pain (kidney stone). (Patient not taking: Reported on 07/27/2021)   [DISCONTINUED] tamsulosin (FLOMAX) 0.4 MG CAPS capsule Take 1 capsule (0.4 mg total) by mouth daily. (Patient not taking: No sig reported)   No facility-administered encounter medications on file as of 07/27/2021.    Allergies as of 07/27/2021 - Review Complete 07/27/2021  Allergen Reaction Noted   Benadryl [diphenhydramine] Hives and Swelling 04/28/2020   Other Swelling 05/07/2018   Mushroom extract complex Nausea Only 01/09/2020    Peanut-containing drug products Nausea And Vomiting 01/09/2020    Past Medical History:  Diagnosis Date   Bulging lumbar disc    L4-L5, L5-S1   DDD (degenerative disc disease), lumbosacral    History of kidney stones 2021   History of migraine 2017   Lumbar stenosis    Nocturia    OSA (obstructive sleep apnea)    not currently using CPAP has had weight loss   (sleep study in epic 08/ 2015)   Renal calculus, left    Sciatica, left side     Past Surgical History:  Procedure Laterality Date   COLONOSCOPY  12/2018   CYSTOSCOPY WITH RETROGRADE PYELOGRAM, URETEROSCOPY AND STENT PLACEMENT Left 07/18/2020   Procedure: CYSTOSCOPY WITH RETROGRADE PYELOGRAM, URETEROSCOPY AND STENT EXCHANGE;  Surgeon: Daniel Christmas, MD;  Location: [facility];  Service: Urology;  Laterality: Left;  1 HR   EYE SURGERY  1977   Cyst removed- 33-77 year old, left eye   HOLMIUM LASER APPLICATION Left 01/22/2020   Procedure: HOLMIUM LASER APPLICATION;  Surgeon: Daniel Christmas, MD;  Location: [facility];  Service: Urology;  Laterality: Left;   HOLMIUM LASER APPLICATION Left 07/18/2020   Procedure: HOLMIUM LASER APPLICATION;  Surgeon: Daniel Christmas, MD;  Location: [facility];  Service: Urology;  Laterality: Left;   IR URETERAL STENT LEFT NEW ACCESS W/O SEP NEPHROSTOMY CATH  01/22/2020   NEPHROLITHOTOMY Left 01/22/2020   Procedure: NEPHROLITHOTOMY PERCUTANEOUS;  Surgeon: Daniel Christmas, MD;  Location: [facility];  Service: Urology;  Laterality: Left;  2 HRS   QUADRICEPS TENDON REPAIR Left 05/2018    Family History  Problem Relation Age of Onset   Bladder Cancer Father    Hypertension Father    Diabetes Father    Hypertension Brother     Social History   Socioeconomic History   Marital status: Married    Spouse name: Daniel Deleon   Number of children: 3   Years of education: Grad sch.   Highest education level: Not on file  Occupational History   Not on file  Tobacco Use    Smoking status: Never   Smokeless tobacco: Never  Vaping Use   Vaping Use: Never used  Substance and Sexual Activity   Alcohol use: No   Drug use: Never   Sexual activity: Not on file  Other Topics Concern   Not on file  Social History Narrative   Patient is married Daniel Deleon) and lives at home with his wife and three children.   Patient works full-time at [employer].   Patient has a college education [employer] school).   Patient is ambi-dextrous.   Patient drinks one soda daily.   Social Determinants of Health   Financial Resource Strain: Not on file  Food Insecurity: Not on file  Transportation Needs: Not on file  Physical Activity: Not on file  Stress: Not on file  Social Connections: Not on file  Intimate Partner Violence: Not on file    Review of Systems  Constitutional:  Negative for fatigue.  Respiratory:  Positive for apnea.   Psychiatric/Behavioral:  Positive for sleep disturbance.    Vitals:   07/27/21 1615  BP: 120/78  Pulse: 63  Temp: 98.3 F (36.8 C)  SpO2: 97%     Physical Exam Constitutional:      Appearance: He is obese.  HENT:     Head: Normocephalic.     Right Ear: Tympanic membrane normal.     Nose: No congestion.     Mouth/Throat:     Mouth: Mucous membranes are moist.     Comments: Mallampati 4, macroglossia, crowded oropharynx Eyes:     Pupils: Pupils are equal, round, and reactive to light.  Cardiovascular:     Rate and Rhythm: Normal rate and regular rhythm.     Heart sounds: No murmur heard.   No friction rub.  Pulmonary:     Effort: No respiratory distress.     Breath sounds: No stridor. No wheezing or rhonchi.  Musculoskeletal:     Cervical back: No rigidity or tenderness.  Neurological:     Mental Status: He is alert.  Psychiatric:        Mood and Affect: Mood normal.   Results of the Epworth flowsheet 07/27/2021  Sitting and reading 1  Watching TV 1  Sitting, inactive in a public place (e.g. a theatre or a  meeting) 2  As a passenger in a car for an hour without a break 0  Lying down to rest in the afternoon when circumstances permit 3  Sitting and talking to someone 0  Sitting quietly after a lunch without alcohol 1  In a car, while stopped for a few minutes in traffic 0  Total score 8     Data Reviewed: Previous study from 2015 with very severe obstructive sleep apnea, significant number of central events associated  Assessment:  History of obstructive sleep apnea  Daytime sleepiness  Obesity  Pathophysiology of sleep disordered breathing  discussed  Previous study had shown central sleep apnea events  Plan/Recommendations: Schedule split-night study  Encourage weight loss efforts  Call with significant concerns   Virl Diamond MD [facility] 07/27/2021, 4:33 PM  CC: Daniel Devon, MD

## 2021-07-27 NOTE — Addendum Note (Signed)
Addended by: Arvilla Market on: 07/27/2021 04:50 PM   Modules accepted: Orders

## 2021-07-27 NOTE — Patient Instructions (Signed)
History of sleep apnea  Schedule you for in lab split-night study -Diagnosis and therapeutic trial with CPAP  Continue weight loss efforts  Tentative follow-up in 2 to 3 months  Call with significant concerns

## 2021-09-09 ENCOUNTER — Ambulatory Visit (HOSPITAL_BASED_OUTPATIENT_CLINIC_OR_DEPARTMENT_OTHER): Payer: BC Managed Care – PPO | Attending: Pulmonary Disease | Admitting: Pulmonary Disease

## 2021-09-09 ENCOUNTER — Other Ambulatory Visit: Payer: Self-pay

## 2021-09-09 DIAGNOSIS — G4733 Obstructive sleep apnea (adult) (pediatric): Secondary | ICD-10-CM | POA: Insufficient documentation

## 2021-09-17 ENCOUNTER — Telehealth: Payer: Self-pay | Admitting: Pulmonary Disease

## 2021-09-17 DIAGNOSIS — G4733 Obstructive sleep apnea (adult) (pediatric): Secondary | ICD-10-CM

## 2021-09-17 NOTE — Telephone Encounter (Signed)
Call patient  Sleep study result  Date of study: 09/09/2021  Impression: Severe obstructive sleep apnea, adequately treated with CPAP therapy  Recommendation: DME referral  Recommend CPAP therapy for severe obstructive sleep apnea  Trial of CPAP therapy on 9 cm H2O with a Medium size Resmed Nasal CPAP Mask AirFit N30i mask and heated humidification.  Encourage weight loss measures  Follow-up in the office 4 to 6 weeks following initiation of treatment

## 2021-09-17 NOTE — Telephone Encounter (Signed)
I called the patient and gave him the resutls per the provider  and he did not have any questions. New CPAP order placed.

## 2021-09-17 NOTE — Procedures (Signed)
POLYSOMNOGRAPHY  Last, First: Daniel Deleon, Daniel Deleon MRN: [id number] Gender: Male Age (years): 55 Weight (lbs): 226 DOB: [birth year] BMI: 32 Primary Care: No PCP Epworth Score: 6 Referring: Tomma Lightning MD Technician: Lowry Ram Interpreting: Tomma Lightning MD Study Type: Split Night CPAP Ordered Study Type: Split Night CPAP Study date: 09/09/2021 Location: [place] CLINICAL INFORMATION Daniel Deleon is a 55 year old Male and was referred to the sleep center for evaluation of G47.33 OSA: Adult and Pediatric (327.23). Indications include Diabetes, Fatigue, Obesity, Re-Evaluation, Snoring.  MEDICATIONS Patient self administered medications include: N/A. Medications administered during study include No sleep medicine administered.  SLEEP STUDY TECHNIQUE The patient underwent an attended overnight level one polysomnography titration to assess the effects of CPAP therapy. The following variables were monitored: EEG (C4-A1, C3-A2, O1-A2, O2-A1), EOG, submental and leg EMG, ECG, oxyhemoglobin saturation by pulse oximetry, thoracic and abdominal respiratory effort belts, nasal/oral airflow by pressure sensor, body position sensor and snoring sensor. CPAP pressure was titrated to eliminate apneas, hypopneas and oxygen desaturation. Hypopneas were scored per AASM definition IB (4% desaturation)  The NPSG portion of the study ended at 1:23:01 AM . The CPAP titration was initiated at 1:36:11 AM AM with the CPAP portion of the study ending at 5:13:07 AM.  TECHNICIAN COMMENTS Comments added by Technician: Patient had difficulty initiating sleep. Patient was restless all through the night. Comments added by Scorer: N/A SLEEP ARCHITECTURE The recording time for the entire night was 402 minutes. The diagnostic portion was initiated at 10:31:08 PM and terminated at 1:23:01 AM. The time in bed was 171.9 minutes. EEG confirmed total sleep time was 150.7 minutes yielding a sleep efficiency of  87.7%%. Sleep onset after lights out was 12.6 minutes with a REM latency of 71.5 minutes. The patient spent 6.3%% of the night in stage N1 sleep, 82.1%% in stage N2 sleep, 0.0%% in stage N3 and 11.6% in REM. The Arousal Index was 23.5/hour.  The titration portion was initiated at 1:36:11 AM and terminated at 5:13:07 AM. The time in bed was 216.9 minutes. EEG confirmed total sleep time was 123.3 minutes yielding a sleep efficiency of 56.9%%. Sleep onset after CPAP initiation was 16.6 minutes with a REM latency of 98.0 minutes. The patient spent 14.2%% of the night in stage N1 sleep, 49.3%% in stage N2 sleep, 0.0%% in stage N3 and 36.5% in REM. The Arousal Index was 12.6/hour. RESPIRATORY PARAMETERS During the diagnostic portion, there were a total of 122 respiratory disturbances recorded; 2 apneas ( 2 obstructive, 0 mixed, 0 central), 120 hypopneas and 0 RERAs. The apnea/hypopnea index 48.6 was events/hour and the RDI was 48.6 events/hour. The central sleep apnea index was 0.0 events/hour. The REM AHI was 82.3 /h and NREM AHI was 44.1/h. The REM RDI was 82.3 /h and NREM RDI was 44.1 /h. The supine AHI was 82.3/h, and the non supine AHI was 21/h; supine during 45.0%% of sleep. The supine RDI was 82.3/h, and the non supine RDI was 20.98/h. Respiratory disturbances were associated with oxygen desaturation down to a nadir of 81.0 % during sleep. The mean oxygen saturation during the study was 91.5%. The cumulative time under 88% oxygen saturation was 14.8 minutes.  During the titration portion, the apnea/hypopnea index (AHI) was 1.9 events/hour and the RDI was 1.9 events/hour. The central sleep apnea index was events/hour. The most appropriate setting of CPAP was IPAP/EPAP 9/9 cm H2O. At this setting, the sleep efficiency was 62% and the patient was supine for 5%. The AHI  was 0 events per hour(with 0 central events). Oxygen nadir was 89.0. LEG MOVEMENT DATA The periodic limb movement index was 0.0/hour with an  associated arousal index of /hour. CARDIAC DATA The underlying cardiac rhythm was most consistent with sinus rhythm. Mean heart rate was 76.8 during diagnostic portion and 73.9 during titration portion of study. Additional rhythm abnormalities include None.  IMPRESSIONS - Severe Obstructive Sleep apnea(OSA) Optimal pressure attained. - EKG showed no cardiac abnormalities. - Moderate Oxygen Desaturation - The patient snored with moderate snoring volume. - EEG did not show alpha intrusion. - No significant periodic leg movements(PLMs) during sleep. However, no significant associated arousals. - Normal sleep efficiency, normal primary sleep latency, short REM sleep latency and no slow wave latency.  DIAGNOSIS - Obstructive Sleep Apnea (G47.33)  RECOMMENDATIONS - Trial of CPAP therapy on 9 cm H2O with a Medium size Resmed Nasal CPAP Mask AirFit N30i mask and heated humidification. - Avoid alcohol, sedatives and other CNS depressants that may worsen sleep apnea and disrupt normal sleep architecture. - Sleep hygiene should be reviewed to assess factors that may improve sleep quality. - Weight management and regular exercise should be initiated or continued. - Follow up as scheduled  [Electronically signed] 09/17/2021 06:54 AM  Virl Diamond MD NPI: [id number]

## 2024-04-11 ENCOUNTER — Other Ambulatory Visit (HOSPITAL_COMMUNITY): Payer: Self-pay

## 2024-04-11 MED ORDER — OZEMPIC (1 MG/DOSE) 4 MG/3ML ~~LOC~~ SOPN
1.0000 mg | PEN_INJECTOR | SUBCUTANEOUS | 5 refills | Status: DC
  Filled 2024-04-11: qty 3, 28d supply, fill #0

## 2024-09-14 ENCOUNTER — Other Ambulatory Visit: Payer: Self-pay | Admitting: Urology

## 2024-09-14 DIAGNOSIS — R972 Elevated prostate specific antigen [PSA]: Secondary | ICD-10-CM

## 2024-09-17 ENCOUNTER — Encounter: Payer: Self-pay | Admitting: Urology

## 2024-11-06 ENCOUNTER — Other Ambulatory Visit

## 2024-11-08 ENCOUNTER — Ambulatory Visit
Admission: RE | Admit: 2024-11-08 | Discharge: 2024-11-08 | Disposition: A | Discharge status: Home / Self Care | Source: Ambulatory Visit | Attending: Urology | Admitting: Urology

## 2024-11-08 DIAGNOSIS — R972 Elevated prostate specific antigen [PSA]: Secondary | ICD-10-CM

## 2024-11-08 MED ORDER — GADOPICLENOL 0.5 MMOL/ML IV SOLN
10.0000 mL | Freq: Once | INTRAVENOUS | Status: AC | PRN
  Administered 2024-11-08: 10 mL via INTRAVENOUS
